# Patient Record
Sex: Male | Born: 1950 | Race: White | Hispanic: No | Marital: Married | Smoking: Former smoker
Health system: Southern US, Community
[De-identification: ages and names within clinical notes are randomized; demographics above are authoritative.]

## PROBLEM LIST (undated history)

## (undated) DIAGNOSIS — F1010 Alcohol abuse, uncomplicated: Secondary | ICD-10-CM

## (undated) DIAGNOSIS — I517 Cardiomegaly: Secondary | ICD-10-CM

## (undated) DIAGNOSIS — C61 Malignant neoplasm of prostate: Secondary | ICD-10-CM

## (undated) DIAGNOSIS — I10 Essential (primary) hypertension: Secondary | ICD-10-CM

## (undated) DIAGNOSIS — C4490 Unspecified malignant neoplasm of skin, unspecified: Secondary | ICD-10-CM

## (undated) DIAGNOSIS — I4819 Other persistent atrial fibrillation: Secondary | ICD-10-CM

## (undated) HISTORY — PX: SHOULDER SURGERY: SHX246

## (undated) HISTORY — DX: Cardiomegaly: I51.7

## (undated) HISTORY — DX: Essential (primary) hypertension: I10

## (undated) HISTORY — DX: Other persistent atrial fibrillation: I48.19

## (undated) HISTORY — PX: CATARACT EXTRACTION: SUR2

## (undated) HISTORY — DX: Alcohol abuse, uncomplicated: F10.10

---

## 2010-03-12 ENCOUNTER — Ambulatory Visit: Payer: Self-pay | Admitting: Cardiology

## 2010-03-18 ENCOUNTER — Ambulatory Visit: Payer: Self-pay | Admitting: Cardiology

## 2010-03-18 HISTORY — PX: US ECHOCARDIOGRAPHY: HXRAD669

## 2010-07-28 ENCOUNTER — Ambulatory Visit: Payer: Self-pay | Admitting: Cardiology

## 2010-10-23 ENCOUNTER — Other Ambulatory Visit: Payer: Self-pay | Admitting: *Deleted

## 2010-10-23 DIAGNOSIS — I10 Essential (primary) hypertension: Secondary | ICD-10-CM

## 2010-10-23 DIAGNOSIS — I4891 Unspecified atrial fibrillation: Secondary | ICD-10-CM

## 2010-10-23 MED ORDER — METOPROLOL TARTRATE 50 MG PO TABS: 50.0000 mg | ORAL_TABLET | Freq: Two times a day (BID) | ORAL | Status: DC

## 2010-10-23 MED ORDER — METOPROLOL SUCCINATE ER 50 MG PO TB24: 50.0000 mg | ORAL_TABLET | Freq: Every day | ORAL | Status: DC

## 2010-10-23 NOTE — Telephone Encounter (Signed)
Received refill request via fax

## 2011-03-23 ENCOUNTER — Encounter: Payer: Self-pay | Admitting: Cardiology

## 2011-04-07 ENCOUNTER — Ambulatory Visit: Payer: Self-pay | Admitting: Cardiology

## 2011-04-23 ENCOUNTER — Encounter: Payer: Self-pay | Admitting: Cardiology

## 2011-06-23 ENCOUNTER — Telehealth: Payer: Self-pay | Admitting: Cardiology

## 2011-06-23 NOTE — Telephone Encounter (Signed)
Spoke with patient and offered appointment for tomorrow but he was going to be out of town. Doesn't feel like out of rhythm now, worked in with Lawson Fiscal NP Friday am.

## 2011-06-23 NOTE — Telephone Encounter (Signed)
New message:  Abnormal heart rate has returned for several weeks/comes and goes.  He would like to be seen for this.  Please call him regarding this.  Offered Dec 6 appt.

## 2011-06-25 ENCOUNTER — Ambulatory Visit (INDEPENDENT_AMBULATORY_CARE_PROVIDER_SITE_OTHER): Payer: BC Managed Care – PPO | Admitting: Nurse Practitioner

## 2011-06-25 ENCOUNTER — Encounter: Payer: Self-pay | Admitting: Nurse Practitioner

## 2011-06-25 VITALS — BP 142/90 | HR 82 | Ht 71.75 in | Wt 223.0 lb

## 2011-06-25 DIAGNOSIS — I480 Paroxysmal atrial fibrillation: Secondary | ICD-10-CM | POA: Insufficient documentation

## 2011-06-25 DIAGNOSIS — I10 Essential (primary) hypertension: Secondary | ICD-10-CM | POA: Insufficient documentation

## 2011-06-25 DIAGNOSIS — I4891 Unspecified atrial fibrillation: Secondary | ICD-10-CM

## 2011-06-25 LAB — CBC WITH DIFFERENTIAL/PLATELET
Basophils Absolute: 0 10*3/uL (ref 0.0–0.1)
Basophils Relative: 0.5 % (ref 0.0–3.0)
Eosinophils Absolute: 0 10*3/uL (ref 0.0–0.7)
Eosinophils Relative: 0.6 % (ref 0.0–5.0)
HCT: 43.3 % (ref 39.0–52.0)
Hemoglobin: 14.8 g/dL (ref 13.0–17.0)
Lymphocytes Relative: 25.3 % (ref 12.0–46.0)
Lymphs Abs: 1.7 10*3/uL (ref 0.7–4.0)
MCHC: 34.1 g/dL (ref 30.0–36.0)
MCV: 95.1 fl (ref 78.0–100.0)
Monocytes Absolute: 0.5 10*3/uL (ref 0.1–1.0)
Monocytes Relative: 7.4 % (ref 3.0–12.0)
Neutro Abs: 4.3 10*3/uL (ref 1.4–7.7)
Neutrophils Relative %: 66.2 % (ref 43.0–77.0)
Platelets: 200 10*3/uL (ref 150.0–400.0)
RBC: 4.56 Mil/uL (ref 4.22–5.81)
RDW: 13.1 % (ref 11.5–14.6)
WBC: 6.6 10*3/uL (ref 4.5–10.5)

## 2011-06-25 LAB — BASIC METABOLIC PANEL
BUN: 22 mg/dL (ref 6–23)
CO2: 27 mEq/L (ref 19–32)
Calcium: 9 mg/dL (ref 8.4–10.5)
Chloride: 106 mEq/L (ref 96–112)
Creatinine, Ser: 1.1 mg/dL (ref 0.4–1.5)
GFR: 70.24 mL/min (ref >60.00)
Glucose, Bld: 97 mg/dL (ref 70–99)
Potassium: 3.7 mEq/L (ref 3.5–5.1)
Sodium: 141 mEq/L (ref 135–145)

## 2011-06-25 LAB — TSH: TSH: 1.51 u[IU]/mL (ref 0.35–5.50)

## 2011-06-25 MED ORDER — DABIGATRAN ETEXILATE MESYLATE 150 MG PO CAPS: 150.0000 mg | ORAL_CAPSULE | Freq: Two times a day (BID) | ORAL | Status: DC

## 2011-06-25 NOTE — Patient Instructions (Signed)
We are going to check some labs today.  We are going to update your ultrasound of your heart  We are going to put you back on Pradaxa 150 mg two times a day.  Stop your aspirin on Sunday.  We are going to put a monitor on you to look at your heart rhythm for the next month.  We will see you in a month.  Call the [facility] office at [phone] if you have any questions, problems or concerns.

## 2011-06-25 NOTE — Progress Notes (Signed)
   Villa Herb Date of Birth: [birth year] Medical Record #[id number]  History of Present Illness: Thomas Patterson is seen today for a work in visit. He is seen for Dr. Patty Sermons. I have taken care of his father for many years. Jia is complaining of recurrent palpitations. Felt like he has had recurrent atrial fib. This has been going on for a couple of weeks. He feels like it comes and goes. He does check his pulse and notes irregularity. He is symptomatic with some shortness of breath, chest tightness and easy fatigue. Not really dizzy. No syncope. Remains on aspirin. Notes that he cut his Toprol back to just a half some time ago due to fatigue.   Current Outpatient Prescriptions on File Prior to Visit  Medication Sig Dispense Refill  . aspirin 325 MG tablet Take 325 mg by mouth daily.        Marland Kitchen atorvastatin (LIPITOR) 20 MG tablet Take 20 mg by mouth daily.        . hydrochlorothiazide (HYDRODIURIL) 25 MG tablet Take 25 mg by mouth daily.        . quinapril (ACCUPRIL) 40 MG tablet Take 40 mg by mouth at bedtime.        Marland Kitchen DISCONTD: metoprolol (TOPROL XL) 50 MG 24 hr tablet Take 1 tablet (50 mg total) by mouth daily.  30 tablet  11    No Known Allergies  Past Medical History  Diagnosis Date  . Paroxysmal atrial fibrillation   . Palpitations   . Hypertension   . LVH (left ventricular hypertrophy)     Mild per echo in 2011    Past Surgical History  Procedure Date  . US echocardiography 03/18/2010    EF 55-60%    History  Smoking status  . Former Smoker  . Quit date: 07/26/1974  Smokeless tobacco  . Not on file    History  Alcohol Use  . Yes    Family History  Problem Relation Age of Onset  . Heart failure Father   . Cardiomyopathy Father   . Atrial fibrillation Father     Review of Systems: The review of systems is per the HPI.  All other systems were reviewed and are negative.  Physical Exam: BP 142/90  Pulse 82  Ht 5' 11.75" (1.822 m)  Wt 223 lb (101.152 kg)  BMI  30.46 kg/m2 Patient is very pleasant and in no acute distress. Skin is warm and dry. Color is normal.  HEENT is unremarkable. Normocephalic/atraumatic. PERRL. Sclera are nonicteric. Neck is supple. No masses. No JVD. Lungs are clear. Cardiac exam shows an irregular rhythm. Rate is controlled. Abdomen is soft. Extremities are without edema. Gait and ROM are intact. No gross neurologic deficits noted.   LABORATORY DATA: EKG shows atrial fib/flutter. Rate is controlled. Tracing is reviewed with Dr. Patty Sermons.   Assessment / Plan:

## 2011-06-25 NOTE — Assessment & Plan Note (Addendum)
He is out of rhythm today. Difficult to say if he is going in and out versus just staying out. I have discussed his case with Dr. Patty Sermons. We will check labs today, update his echo and place an event monitor for the next 30 days to help define his treatment plan. We will also place him back on Pradaxa 150 mg BID. Samples are given. Prescription is sent to the drug store. He is to stop his aspirin on Sunday. We will see him back in one month. Patient is agreeable to this plan and will call if any problems develop in the interim.

## 2011-06-25 NOTE — Assessment & Plan Note (Signed)
Currently on medicines. Has known mild LVH per last echo. We will update.

## 2011-06-26 DIAGNOSIS — I4891 Unspecified atrial fibrillation: Secondary | ICD-10-CM

## 2011-06-28 ENCOUNTER — Ambulatory Visit (HOSPITAL_COMMUNITY): Payer: BC Managed Care – PPO | Attending: Nurse Practitioner | Admitting: Radiology

## 2011-06-28 DIAGNOSIS — I4891 Unspecified atrial fibrillation: Secondary | ICD-10-CM

## 2011-06-28 DIAGNOSIS — I059 Rheumatic mitral valve disease, unspecified: Secondary | ICD-10-CM | POA: Insufficient documentation

## 2011-06-28 DIAGNOSIS — I079 Rheumatic tricuspid valve disease, unspecified: Secondary | ICD-10-CM | POA: Insufficient documentation

## 2011-06-28 DIAGNOSIS — I10 Essential (primary) hypertension: Secondary | ICD-10-CM | POA: Insufficient documentation

## 2011-06-28 DIAGNOSIS — I379 Nonrheumatic pulmonary valve disorder, unspecified: Secondary | ICD-10-CM | POA: Insufficient documentation

## 2011-06-28 DIAGNOSIS — R002 Palpitations: Secondary | ICD-10-CM | POA: Insufficient documentation

## 2011-06-29 ENCOUNTER — Telehealth: Payer: Self-pay | Admitting: *Deleted

## 2011-06-29 NOTE — Telephone Encounter (Signed)
Advised of results

## 2011-06-29 NOTE — Telephone Encounter (Signed)
Message copied by Burnell Blanks on Tue Jun 29, 2011  9:55 AM ------      Message from: Cassell Clement      Created: Mon Jun 28, 2011  7:41 PM       Please report.  The echocardiogram is satisfactory and shows good left ventricular systolic function.  Both atria are mildly dilated.  No thrombi were seen.  The valve function appeared to be good.  Continue present regimen.

## 2011-06-29 NOTE — Telephone Encounter (Signed)
Message copied by Burnell Blanks on Tue Jun 29, 2011  9:55 AM ------      Message from: Rosalio Macadamia      Created: Mon Jun 28, 2011  7:48 AM       Ok to report. Labs are satisfactory.

## 2011-06-29 NOTE — Telephone Encounter (Signed)
Message copied by Burnell Blanks on Tue Jun 29, 2011  9:51 AM ------      Message from: Cassell Clement      Created: Mon Jun 28, 2011  7:41 PM       Please report.  The echocardiogram is satisfactory and shows good left ventricular systolic function.  Both atria are mildly dilated.  No thrombi were seen.  The valve function appeared to be good.  Continue present regimen.

## 2011-07-13 ENCOUNTER — Telehealth: Payer: Self-pay | Admitting: *Deleted

## 2011-07-13 NOTE — Telephone Encounter (Signed)
Dr. Patty Sermons reviewed ecardio readings and patient patient has a fib with elevated heart rate. Will increase his Toprol to 50 mg daily.  Advised patient and will continue to monitor.

## 2011-07-28 ENCOUNTER — Encounter: Payer: Self-pay | Admitting: Cardiology

## 2011-08-02 ENCOUNTER — Encounter: Payer: Self-pay | Admitting: Cardiology

## 2011-08-02 ENCOUNTER — Ambulatory Visit (INDEPENDENT_AMBULATORY_CARE_PROVIDER_SITE_OTHER): Payer: BC Managed Care – PPO | Admitting: Cardiology

## 2011-08-02 VITALS — BP 138/80 | HR 80 | Ht 72.0 in | Wt 216.0 lb

## 2011-08-02 DIAGNOSIS — I119 Hypertensive heart disease without heart failure: Secondary | ICD-10-CM

## 2011-08-02 DIAGNOSIS — I480 Paroxysmal atrial fibrillation: Secondary | ICD-10-CM

## 2011-08-02 DIAGNOSIS — I10 Essential (primary) hypertension: Secondary | ICD-10-CM

## 2011-08-02 DIAGNOSIS — I4891 Unspecified atrial fibrillation: Secondary | ICD-10-CM

## 2011-08-02 MED ORDER — LOSARTAN POTASSIUM 50 MG PO TABS: 50.0000 mg | ORAL_TABLET | Freq: Every day | ORAL | Status: DC

## 2011-08-02 NOTE — Progress Notes (Signed)
Thomas Patterson Date of Birth:  24-Dec-1950 [facility] [address] [place], [place]  [address] [phone]         Fax   [phone]  History of Present Illness: This pleasant 61 year old gentleman is seen for a scheduled followup office visit.  The patient has a history of atrial fibrillation.  His atrial fibrillation has previously been paroxysmal.  He had a recent 30 day heart monitor which showed that he was in atrial fibrillation the entire time is less aware of his heart rhythm now.  He rarely has any shortness of breath and he denies any chest pain.  He has lost 7 pounds and his blood pressure is doing better.  He is due to have his annual physical examination at [facility] next month.  He has not had any symptoms of TIA or stroke.  He remains on pradaxa 150 mg twice a day.  He has been having some symptoms which he thinks is from his quinapril.  After he takes it he feels a tightness in his throat making it difficulty for for him to put his tie  on in the morning comfortably.  The patient continues to drink a moderate amount of beer.  He would drink 2 or 3 beers most nights and sometimes up to 6-8 beers.  Last night because of a family gathering he estimates he had 6-8 beers.  Current Outpatient Prescriptions  Medication Sig Dispense Refill  . atorvastatin (LIPITOR) 20 MG tablet Take 20 mg by mouth daily.        . dabigatran (PRADAXA) 150 MG CAPS Take 1 capsule (150 mg total) by mouth every 12 (twelve) hours.  60 capsule  6  . hydrochlorothiazide (HYDRODIURIL) 25 MG tablet Take 25 mg by mouth daily.        . metoprolol (TOPROL-XL) 50 MG 24 hr tablet Take 50 mg by mouth daily.       Marland Kitchen losartan (COZAAR) 50 MG tablet Take 1 tablet (50 mg total) by mouth daily.  30 tablet  11  . DISCONTD: metoprolol (TOPROL XL) 50 MG 24 hr tablet Take 1 tablet (50 mg total) by mouth daily.  30 tablet  11    No Known Allergies  Patient Active Problem List  Diagnoses  . PAF (paroxysmal  atrial fibrillation)  . HTN (hypertension)    History  Smoking status  . Former Smoker  . Quit date: 07/26/1974  Smokeless tobacco  . Not on file    History  Alcohol Use  . Yes    Family History  Problem Relation Age of Onset  . Heart failure Father   . Cardiomyopathy Father   . Atrial fibrillation Father     Review of Systems: Constitutional: no fever chills diaphoresis or fatigue or change in weight.  Head and neck: no hearing loss, no epistaxis, no photophobia or visual disturbance. Respiratory: No cough, shortness of breath or wheezing. Cardiovascular: No chest pain peripheral edema, palpitations. Gastrointestinal: No abdominal distention, no abdominal pain, no change in bowel habits hematochezia or melena. Genitourinary: No dysuria, no frequency, no urgency, no nocturia. Musculoskeletal:No arthralgias, no back pain, no gait disturbance or myalgias. Neurological: No dizziness, no headaches, no numbness, no seizures, no syncope, no weakness, no tremors. Hematologic: No lymphadenopathy, no easy bruising. Psychiatric: No confusion, no hallucinations, no sleep disturbance.    Physical Exam: Filed Vitals:   08/02/11 1109  BP: 138/80  Pulse: 80   the general appearance reveals a well-developed well-nourished gentleman in no  distress.Pupils equal and reactive.   Extraocular Movements are full.  There is no scleral icterus.  The mouth and pharynx are normal.  The neck is supple.  The carotids reveal no bruits.  The jugular venous pressure is normal.  The thyroid is not enlarged.  There is no lymphadenopathy.  The chest is clear to percussion and auscultation. There are no rales or rhonchi. Expansion of the chest is symmetrical.  The precordium is quiet.  The first heart sound is normal.  The second heart sound is physiologically split.  There is no murmur gallop rub or click.  There is no abnormal lift or heave.  The pulse is irregular in atrial fibrillation with a  controlled ventricular response.  The abdomen is soft and nontender. Bowel sounds are normal. The liver and spleen are not enlarged. There Are no abdominal masses. There are no bruits.  The pedal pulses are good.  There is no phlebitis or edema.  There is no cyanosis or clubbing. Strength is normal and symmetrical in all extremities.  There is no lateralizing weakness.  There are no sensory deficits.  The skin is warm and dry.  There is no rash.    Assessment / Plan: Continue same medication except stop quinapril and begin losartan 50 mg one daily.  Also asked him to cut way back on his beer consumption which may be contributing to his tendency toward maintaining atrial fibrillation.  He'll return in 2 months for followup office visit EKG and basal metabolic panel.  Next month he will have his annual physical at [facility].

## 2011-08-02 NOTE — Assessment & Plan Note (Signed)
Blood pressure has improved since he has lost weight since last visit.  He is trying to watch his diet and cut back on salt.

## 2011-08-02 NOTE — Assessment & Plan Note (Signed)
The patient denies any symptoms of congestive heart failure.  Having no chest pain.  He is not having any TIA symptoms.  No side effects from the blood thinner

## 2011-08-02 NOTE — Patient Instructions (Signed)
Stop Quinapril and begin Losartan 50 mg daily Decrease alcohol intake Your physician recommends that you schedule a follow-up appointment in: 2 months with labs (bmet) and EKG

## 2011-10-14 ENCOUNTER — Encounter: Payer: Self-pay | Admitting: Cardiology

## 2011-10-15 ENCOUNTER — Encounter: Payer: Self-pay | Admitting: Cardiology

## 2011-10-15 ENCOUNTER — Other Ambulatory Visit: Payer: BC Managed Care – PPO

## 2011-10-15 ENCOUNTER — Ambulatory Visit (INDEPENDENT_AMBULATORY_CARE_PROVIDER_SITE_OTHER): Payer: BC Managed Care – PPO | Admitting: Cardiology

## 2011-10-15 VITALS — BP 118/86 | HR 80 | Ht 71.0 in | Wt 220.0 lb

## 2011-10-15 DIAGNOSIS — I119 Hypertensive heart disease without heart failure: Secondary | ICD-10-CM

## 2011-10-15 DIAGNOSIS — I4891 Unspecified atrial fibrillation: Secondary | ICD-10-CM

## 2011-10-15 DIAGNOSIS — I10 Essential (primary) hypertension: Secondary | ICD-10-CM

## 2011-10-15 DIAGNOSIS — I480 Paroxysmal atrial fibrillation: Secondary | ICD-10-CM

## 2011-10-15 NOTE — Patient Instructions (Signed)
Your physician recommends that you continue on your current medications as directed. Please refer to the Current Medication list given to you today.  Your physician recommends that you schedule a follow-up appointment in: 3 months  

## 2011-10-15 NOTE — Assessment & Plan Note (Signed)
The patient is now starting on Cozaar 100 mg daily which will help bring his blood pressure down further.  He has not been having any symptoms from his high blood pressure such as headaches or dizzy spells

## 2011-10-15 NOTE — Assessment & Plan Note (Signed)
The patient remains in atrial fibrillation.  He consumes 2 or 3 beers most nights and sometimes up to 6-8 beers.  We have discussed in the past and again on this visit that the alcohol that he consumes is probably playing a role in his atrial fibrillation.  We would want him to cut way back on his alcohol before trying to electrically cardiovert him.  He is tolerating his arrhythmia fairly well and does not appear to be motivated to change in his social habits at this point.

## 2011-10-15 NOTE — Progress Notes (Signed)
Villa Herb Date of Birth:  [birth year] [facility] [address] [place], [place]  [address] [phone]         Fax   [phone]  History of Present Illness: This pleasant 61 year old gentleman is seen for followup office visit.  He has a history of established atrial fibrillation.  He is on pradaxa 150 mg twice a day.  He has not been experiencing any TIA symptoms.  He denies any chest pain or increasing shortness of breath.  He had a complete physical with his primary care provider yesterday but has not heard about any of the lab work yet.  His blood pressure at his physical was a little high and he is now on a higher dose of Cozaar 100 mg each morning.  Current Outpatient Prescriptions  Medication Sig Dispense Refill  . atorvastatin (LIPITOR) 20 MG tablet Take 20 mg by mouth daily.        . dabigatran (PRADAXA) 150 MG CAPS Take 1 capsule (150 mg total) by mouth every 12 (twelve) hours.  60 capsule  6  . hydrochlorothiazide (HYDRODIURIL) 25 MG tablet Take 25 mg by mouth daily.        Marland Kitchen losartan (COZAAR) 50 MG tablet Take 100 mg by mouth daily.      . metoprolol (TOPROL-XL) 50 MG 24 hr tablet Take 50 mg by mouth daily.       Marland Kitchen DISCONTD: metoprolol (TOPROL XL) 50 MG 24 hr tablet Take 1 tablet (50 mg total) by mouth daily.  30 tablet  11    No Known Allergies  Patient Active Problem List  Diagnoses  . PAF (paroxysmal atrial fibrillation)  . HTN (hypertension)    History  Smoking status  . Former Smoker  . Quit date: 07/26/1974  Smokeless tobacco  . Not on file    History  Alcohol Use  . Yes    Family History  Problem Relation Age of Onset  . Heart failure Father   . Cardiomyopathy Father   . Atrial fibrillation Father     Review of Systems: Constitutional: no fever chills diaphoresis or fatigue or change in weight.  Head and neck: no hearing loss, no epistaxis, no photophobia or visual disturbance. Respiratory: No cough, shortness of breath  or wheezing. Cardiovascular: No chest pain peripheral edema, palpitations. Gastrointestinal: No abdominal distention, no abdominal pain, no change in bowel habits hematochezia or melena. Genitourinary: No dysuria, no frequency, no urgency, no nocturia. Musculoskeletal:No arthralgias, no back pain, no gait disturbance or myalgias. Neurological: No dizziness, no headaches, no numbness, no seizures, no syncope, no weakness, no tremors. Hematologic: No lymphadenopathy, no easy bruising. Psychiatric: No confusion, no hallucinations, no sleep disturbance.    Physical Exam: Filed Vitals:   10/15/11 1059  BP: 118/86  Pulse: 80   the general appearance reveals a well-developed well-nourished gentleman in no distress.The head and neck exam reveals pupils equal and reactive.  Extraocular movements are full.  There is no scleral icterus.  The mouth and pharynx are normal.  The neck is supple.  The carotids reveal no bruits.  The jugular venous pressure is normal.  The  thyroid is not enlarged.  There is no lymphadenopathy.  The chest is clear to percussion and auscultation.  There are no rales or rhonchi.  Expansion of the chest is symmetrical.  The precordium is quiet.  The first heart sound is normal.  The second heart sound is physiologically split.  There is no murmur gallop rub or  click.  There is no abnormal lift or heave.  The rhythm is irregular .The abdomen is soft and nontender.  The bowel sounds are normal.  The liver and spleen are not enlarged.  There are no abdominal masses.  There are no abdominal bruits.  Extremities reveal good pedal pulses.  There is no phlebitis or edema.  There is no cyanosis or clubbing.  Strength is normal and symmetrical in all extremities.  There is no lateralizing weakness.  There are no sensory deficits.  The skin is warm and dry.  There is no rash.  EKG shows atrial fibrillation with controlled ventricular response and no ischemic changes.   Assessment /  Plan: Continue same medication.  Try to work on cutting back on alcohol.  Recheck in 4 months for followup office visit

## 2011-10-24 ENCOUNTER — Other Ambulatory Visit: Payer: Self-pay | Admitting: Cardiology

## 2011-10-25 NOTE — Telephone Encounter (Signed)
..   Requested Prescriptions   Pending Prescriptions Disp Refills  . metoprolol succinate (TOPROL-XL) 50 MG 24 hr tablet [Pharmacy Med Name: METOPROLOL SUCC ER 50 MG TAB] 30 tablet 10    Sig: TAKE 1 TABLET EVERY DAY

## 2011-12-28 ENCOUNTER — Ambulatory Visit: Payer: BC Managed Care – PPO | Admitting: Cardiology

## 2012-01-20 ENCOUNTER — Encounter: Payer: Self-pay | Admitting: Cardiology

## 2012-01-20 ENCOUNTER — Ambulatory Visit (INDEPENDENT_AMBULATORY_CARE_PROVIDER_SITE_OTHER): Payer: BC Managed Care – PPO | Admitting: Cardiology

## 2012-01-20 VITALS — BP 120/80 | HR 80 | Ht 71.0 in | Wt 218.0 lb

## 2012-01-20 DIAGNOSIS — I480 Paroxysmal atrial fibrillation: Secondary | ICD-10-CM

## 2012-01-20 DIAGNOSIS — I10 Essential (primary) hypertension: Secondary | ICD-10-CM

## 2012-01-20 DIAGNOSIS — I4891 Unspecified atrial fibrillation: Secondary | ICD-10-CM

## 2012-01-20 MED ORDER — DILTIAZEM HCL ER COATED BEADS 180 MG PO CP24: 180.0000 mg | ORAL_CAPSULE | Freq: Every day | ORAL | Status: DC

## 2012-01-20 MED ORDER — RIVAROXABAN 20 MG PO TABS: 20.0000 mg | ORAL_TABLET | Freq: Every day | ORAL | Status: DC

## 2012-01-20 NOTE — Patient Instructions (Addendum)
STOP Pradaxa and START Xarelto 20 mg daily  STOP Metoprolol (Toprol) and START Cardizem CD 180 mg daily  Your physician wants you to follow-up in: 3 month  You will receive a reminder letter in the mail two months in advance. If you don't receive a letter, please call our office to schedule the follow-up appointment.

## 2012-01-20 NOTE — Progress Notes (Signed)
Thomas Patterson Date of Birth:  1950-12-01 [facility] [address] [place], [place]  [address] [phone]         Fax   [phone]  History of Present Illness: This pleasant 61 year old gentleman is seen for a scheduled followup office visit.  He has atrial fibrillation.  Previously his atrial fibrillation was paroxysmal but recently it has become more established.  He has not had an attempt at cardioversion.  He does drink a moderate amount of beer on a regular basis we have talked about that on his previous visits.  He is interested in an attempt at cardioversion.  He states that he has not been compliant with taking his Pradaxa because he often forgets to take it in the evening.  We talked about switching to Xarelto which she could take just once a day and he felt that he could be more compliant with a once a day pill.  In addition, he complains of no energy from the Toprol and he frequently takes just half a tablet instead of all tablet because taking a whole tablet makes him feel so tired.  Current Outpatient Prescriptions  Medication Sig Dispense Refill  . atorvastatin (LIPITOR) 20 MG tablet Take 20 mg by mouth daily.        . hydrochlorothiazide (HYDRODIURIL) 25 MG tablet Take 25 mg by mouth daily.        Marland Kitchen losartan (COZAAR) 100 MG tablet Take 100 mg by mouth daily.      Marland Kitchen diltiazem (CARDIZEM CD) 180 MG 24 hr capsule Take 1 capsule (180 mg total) by mouth daily.  30 capsule  5  . Rivaroxaban (XARELTO) 20 MG TABS Take 1 tablet (20 mg total) by mouth daily.  30 tablet  5    No Known Allergies  Patient Active Problem List  Diagnosis  . PAF (paroxysmal atrial fibrillation)  . HTN (hypertension)    History  Smoking status  . Former Smoker  . Quit date: 07/26/1974  Smokeless tobacco  . Not on file    History  Alcohol Use  . Yes    Family History  Problem Relation Age of Onset  . Heart failure Father   . Cardiomyopathy Father   . Atrial  fibrillation Father     Review of Systems: Constitutional: no fever chills diaphoresis or fatigue or change in weight.  Head and neck: no hearing loss, no epistaxis, no photophobia or visual disturbance. Respiratory: No cough, shortness of breath or wheezing. Cardiovascular: No chest pain peripheral edema, palpitations. Gastrointestinal: No abdominal distention, no abdominal pain, no change in bowel habits hematochezia or melena. Genitourinary: No dysuria, no frequency, no urgency, no nocturia. Musculoskeletal:No arthralgias, no back pain, no gait disturbance or myalgias. Neurological: No dizziness, no headaches, no numbness, no seizures, no syncope, no weakness, no tremors. Hematologic: No lymphadenopathy, no easy bruising. Psychiatric: No confusion, no hallucinations, no sleep disturbance.    Physical Exam: Filed Vitals:   01/20/12 1117  BP: 120/80  Pulse: 80   the general appearance reveals a well-developed well-nourished gentleman in no distress.The head and neck exam reveals pupils equal and reactive.  Extraocular movements are full.  There is no scleral icterus.  The mouth and pharynx are normal.  The neck is supple.  The carotids reveal no bruits.  The jugular venous pressure is normal.  The  thyroid is not enlarged.  There is no lymphadenopathy.  The chest is clear to percussion and auscultation.  There are no rales or  rhonchi.  Expansion of the chest is symmetrical.  The precordium is quiet.  The pulse is irregularly irregular in atrial fib with a controlled ventricular response.  The first heart sound is normal.  The second heart sound is physiologically split.  There is no murmur gallop rub or click.  There is no abnormal lift or heave.  The abdomen is soft and nontender.  The bowel sounds are normal.  The liver and spleen are not enlarged.  There are no abdominal masses.  There are no abdominal bruits.  Extremities reveal good pedal pulses.  There is no phlebitis or edema.  There is  no cyanosis or clubbing.  Strength is normal and symmetrical in all extremities.  There is no lateralizing weakness.  There are no sensory deficits.  The skin is warm and dry.  There is no rash.     Assessment / Plan: Because of his fatigue we are going to stop his beta blocker and switch him to Cardizem CD 180 mg one daily. Because he has been forgetting to take his twice a day Pradaxa we are going to switch him to Xarelto 20 mg one daily.  He has normal renal function. He would like to try getting increased exercise and cutting back on his alcohol over the summer and he would like to talk about attempt at elective cardioversion when we see him in 3 months.  I offered him an earlier date such as early August but he preferred to wait and have a longer time to make adjustments in his diet and he and his exercise program and to cut back on alcohol.  He understands that the alcohol makes long-term success of cardioversion less likely.  He will return in 3 months for followup office visit EKG and basal metabolic panel, after which we will talk about looking for a date for cardioversion

## 2012-01-20 NOTE — Assessment & Plan Note (Signed)
Blood pressure has been remaining stable on current therapy.  He is now taking losartan 100 mg daily instead of 50 mg daily.

## 2012-01-20 NOTE — Assessment & Plan Note (Signed)
The patient has not been having any TIA symptoms.  He denies any chest pain or shortness of breath with ordinary activity but is fatigued when he has to climb stairs or walk up a hill.

## 2012-07-03 ENCOUNTER — Other Ambulatory Visit: Payer: Self-pay

## 2012-07-03 DIAGNOSIS — I4891 Unspecified atrial fibrillation: Secondary | ICD-10-CM

## 2012-07-03 MED ORDER — DILTIAZEM HCL ER COATED BEADS 180 MG PO CP24: 180.0000 mg | ORAL_CAPSULE | Freq: Every day | ORAL | Status: DC

## 2012-08-08 ENCOUNTER — Telehealth: Payer: Self-pay | Admitting: *Deleted

## 2012-08-08 ENCOUNTER — Encounter: Payer: Self-pay | Admitting: Cardiology

## 2012-08-08 ENCOUNTER — Ambulatory Visit (INDEPENDENT_AMBULATORY_CARE_PROVIDER_SITE_OTHER): Payer: BC Managed Care – PPO | Admitting: Cardiology

## 2012-08-08 VITALS — BP 132/74 | HR 89 | Resp 18 | Ht 71.0 in | Wt 219.0 lb

## 2012-08-08 DIAGNOSIS — I4891 Unspecified atrial fibrillation: Secondary | ICD-10-CM

## 2012-08-08 DIAGNOSIS — I480 Paroxysmal atrial fibrillation: Secondary | ICD-10-CM

## 2012-08-08 DIAGNOSIS — I10 Essential (primary) hypertension: Secondary | ICD-10-CM

## 2012-08-08 DIAGNOSIS — I119 Hypertensive heart disease without heart failure: Secondary | ICD-10-CM

## 2012-08-08 MED ORDER — DILTIAZEM HCL ER COATED BEADS 180 MG PO CP24: 180.0000 mg | ORAL_CAPSULE | Freq: Every day | ORAL | Status: DC

## 2012-08-08 MED ORDER — RIVAROXABAN 20 MG PO TABS: 20.0000 mg | ORAL_TABLET | Freq: Every day | ORAL | Status: DC

## 2012-08-08 NOTE — Patient Instructions (Addendum)
Your physician recommends that you continue on your current medications as directed. Please refer to the Current Medication list given to you today.  Your physician wants you to follow-up in: 4 MONTH OV/EKG You will receive a reminder letter in the mail two months in advance. If you don't receive a letter, please call our office to schedule the follow-up appointment.  

## 2012-08-08 NOTE — Progress Notes (Signed)
Thomas Patterson Date of Birth:  26-Aug-1950 [facility] [address] [place], [place]  [address] [phone]         Fax   [phone]  History of Present Illness: This pleasant 62 year old gentleman is seen for a scheduled followup office visit. He has atrial fibrillation. Previously his atrial fibrillation was paroxysmal but recently it has become more established. He has not had an attempt at cardioversion. He does drink a moderate amount of beer on a regular basis we have talked about that on his previous visits. He is interested in an attempt at cardioversion.  At his last visit we switched him from Pradaxa to Xarelto and he prefers Xarelto because of its side effect profile.  The patient is disappointed that he has not been able to lose weight.  He is a member of the gym.   Current Outpatient Prescriptions  Medication Sig Dispense Refill  . atorvastatin (LIPITOR) 20 MG tablet Take 20 mg by mouth daily.        Marland Kitchen diltiazem (CARDIZEM CD) 180 MG 24 hr capsule Take 1 capsule (180 mg total) by mouth daily.  90 capsule  3  . hydrochlorothiazide (HYDRODIURIL) 25 MG tablet Take 25 mg by mouth daily.        Marland Kitchen losartan (COZAAR) 100 MG tablet Take 100 mg by mouth daily.      . Rivaroxaban (XARELTO) 20 MG TABS Take 1 tablet (20 mg total) by mouth daily.  90 tablet  3    No Known Allergies  Patient Active Problem List  Diagnosis  . PAF (paroxysmal atrial fibrillation)  . HTN (hypertension)    History  Smoking status  . Former Smoker  . Quit date: 07/26/1974  Smokeless tobacco  . Not on file    History  Alcohol Use  . Yes    Family History  Problem Relation Age of Onset  . Heart failure Father   . Cardiomyopathy Father   . Atrial fibrillation Father     Review of Systems: Constitutional: no fever chills diaphoresis or fatigue or change in weight.  Head and neck: no hearing loss, no epistaxis, no photophobia or visual disturbance. Respiratory: No cough,  shortness of breath or wheezing. Cardiovascular: No chest pain peripheral edema, palpitations. Gastrointestinal: No abdominal distention, no abdominal pain, no change in bowel habits hematochezia or melena. Genitourinary: No dysuria, no frequency, no urgency, no nocturia. Musculoskeletal:No arthralgias, no back pain, no gait disturbance or myalgias. Neurological: No dizziness, no headaches, no numbness, no seizures, no syncope, no weakness, no tremors. Hematologic: No lymphadenopathy, no easy bruising. Psychiatric: No confusion, no hallucinations, no sleep disturbance.    Physical Exam: Filed Vitals:   08/08/12 1102  BP: 132/74  Pulse: 89  Resp: 18   the general appearance reveals a well-developed well-nourished gentleman in no distress.The head and neck exam reveals pupils equal and reactive.  Extraocular movements are full.  There is no scleral icterus.  The mouth and pharynx are normal.  The neck is supple.  The carotids reveal no bruits.  The jugular venous pressure is normal.  The  thyroid is not enlarged.  There is no lymphadenopathy.  The chest is clear to percussion and auscultation.  There are no rales or rhonchi.  Expansion of the chest is symmetrical.  The precordium is quiet.  The pulse is irregularly irregular The first heart sound is normal.  The second heart sound is physiologically split.  There is no murmur gallop rub or click.  There is no abnormal lift or heave.  The abdomen is soft and nontender.  The bowel sounds are normal.  The liver and spleen are not enlarged.  There are no abdominal masses.  There are no abdominal bruits.  Extremities reveal good pedal pulses.  There is no phlebitis or edema.  There is no cyanosis or clubbing.  Strength is normal and symmetrical in all extremities.  There is no lateralizing weakness.  There are no sensory deficits.  The skin is warm and dry.  There is no rash.     Assessment / Plan: The patient is to continue on same medication.  He  needs to cut back on beer consumption and on overall alcohol consumption because it is helping to keep him in atrial fibrillation.  There is no point in attempting a cardioversion yet.  He will try to cut back further on alcohol intake.  He also intends to lose weight and to increase aerobic exercise at the gym.  Recheck in 4 months for followup office visit and EKG.

## 2012-08-08 NOTE — Assessment & Plan Note (Signed)
Blood pressure was remaining stable on current medication.  He is tolerating diltiazem better than beta blocker.  The beta blocker was causing excessive fatigue.

## 2012-08-08 NOTE — Assessment & Plan Note (Signed)
The patient remains in atrial fibrillation with a controlled ventricular response.  He has not had any TIA symptoms.Marland Kitchen

## 2012-08-08 NOTE — Telephone Encounter (Signed)
-----   Message ----- From: Villa Herb Sent: 08/08/2012 2:59 PM To: [facility] Triage Subject: Visit Follow-Up Question On my visit today with Dr. Patty Sermons I forgot to ask him about my colonoscopy that I have shceduled in February. When I made the appointment they told me they prefer stopage of any blood thinners 5 days before the procedure. Seems we may have touched on this at a former appoinment and he may have said 24 hrs before. What is the correct procedure?  Discussed with  Dr. Patty Sermons and patient can hold Xarelto for 48 hours prior to procedure. Left message for patient to call if he doesn't receive reply

## 2012-09-09 ENCOUNTER — Other Ambulatory Visit: Payer: Self-pay

## 2013-05-31 ENCOUNTER — Other Ambulatory Visit: Payer: Self-pay

## 2013-08-09 ENCOUNTER — Other Ambulatory Visit: Payer: Self-pay | Admitting: Cardiology

## 2013-08-20 ENCOUNTER — Ambulatory Visit: Payer: BC Managed Care – PPO | Admitting: Cardiology

## 2013-08-29 ENCOUNTER — Ambulatory Visit (INDEPENDENT_AMBULATORY_CARE_PROVIDER_SITE_OTHER): Payer: BC Managed Care – PPO | Admitting: Cardiology

## 2013-08-29 ENCOUNTER — Encounter: Payer: Self-pay | Admitting: Cardiology

## 2013-08-29 VITALS — BP 160/90 | HR 108 | Ht 71.0 in | Wt 215.0 lb

## 2013-08-29 DIAGNOSIS — I4891 Unspecified atrial fibrillation: Secondary | ICD-10-CM

## 2013-08-29 DIAGNOSIS — I10 Essential (primary) hypertension: Secondary | ICD-10-CM

## 2013-08-29 DIAGNOSIS — I480 Paroxysmal atrial fibrillation: Secondary | ICD-10-CM

## 2013-08-29 MED ORDER — DILTIAZEM HCL ER COATED BEADS 240 MG PO CP24
240.0000 mg | ORAL_CAPSULE | Freq: Every day | ORAL | Status: DC
Start: 2013-08-29 — End: 2014-07-02

## 2013-08-29 NOTE — Assessment & Plan Note (Signed)
Blood pressure today is mildly elevated.  His weight is down 4 pounds since last visit.  He is going to start checking his blood pressure at home.  For better blood pressure control and rate control I will increase his Cardizem CD up to 240 mg daily.

## 2013-08-29 NOTE — Patient Instructions (Signed)
INCREASE YOUR CARDIZEM CD TO 240 MG DAILY, RX SENT TO [facility]  Your physician wants you to follow-up in: [place] will receive a reminder letter in the mail two months in advance. If you don't receive a letter, please call our office to schedule the follow-up appointment.

## 2013-08-29 NOTE — Progress Notes (Signed)
Thomas Patterson Date of Birth:  1951-03-13 [address] [place], [place]  [address] [phone]         Fax   [phone]  History of Present Illness: This pleasant 63 year old gentleman is seen for a scheduled followup office visit. He has atrial fibrillation. Previously his atrial fibrillation was paroxysmal but recently it has become more established. He has not had an attempt at cardioversion. He does drink a moderate amount of beer on a regular basis we have talked about that on his previous visits.   At his last visit we switched him from Pradaxa to Xarelto and he prefers Xarelto because of its side effect profile.  The patient is disappointed that he has not been able to lose weight.  He is a member of the gym but he has not been going to the gym lately.   Current Outpatient Prescriptions  Medication Sig Dispense Refill  . atorvastatin (LIPITOR) 20 MG tablet Take 20 mg by mouth daily.        Marland Kitchen diltiazem (CARDIZEM CD) 240 MG 24 hr capsule Take 1 capsule (240 mg total) by mouth daily.  90 capsule  3  . hydrochlorothiazide (HYDRODIURIL) 25 MG tablet Take 25 mg by mouth daily.        Marland Kitchen losartan (COZAAR) 100 MG tablet Take 100 mg by mouth daily.      Alveda Reasons 20 MG TABS tablet TAKE 1 TABLET (20 MG TOTAL) BY MOUTH DAILY.  90 tablet  0   No current facility-administered medications for this visit.    No Known Allergies  Patient Active Problem List   Diagnosis Date Noted  . PAF (paroxysmal atrial fibrillation) 06/25/2011  . HTN (hypertension) 06/25/2011    History  Smoking status  . Former Smoker  . Quit date: 07/26/1974  Smokeless tobacco  . Not on file    History  Alcohol Use  . Yes    Family History  Problem Relation Age of Onset  . Heart failure Father   . Cardiomyopathy Father   . Atrial fibrillation Father     Review of Systems: Constitutional: no fever chills diaphoresis or fatigue or change in weight.  Head and neck: no hearing loss, no  epistaxis, no photophobia or visual disturbance. Respiratory: No cough, shortness of breath or wheezing. Cardiovascular: No chest pain peripheral edema, palpitations. Gastrointestinal: No abdominal distention, no abdominal pain, no change in bowel habits hematochezia or melena. Genitourinary: No dysuria, no frequency, no urgency, no nocturia. Musculoskeletal:No arthralgias, no back pain, no gait disturbance or myalgias. Neurological: No dizziness, no headaches, no numbness, no seizures, no syncope, no weakness, no tremors. Hematologic: No lymphadenopathy, no easy bruising. Psychiatric: No confusion, no hallucinations, no sleep disturbance.    Physical Exam: Filed Vitals:   08/29/13 1102  BP: 160/90  Pulse: 108   the general appearance reveals a well-developed well-nourished gentleman in no distress.The head and neck exam reveals pupils equal and reactive.  Extraocular movements are full.  There is no scleral icterus.  The mouth and pharynx are normal.  The neck is supple.  The carotids reveal no bruits.  The jugular venous pressure is normal.  The  thyroid is not enlarged.  There is no lymphadenopathy.  The chest is clear to percussion and auscultation.  There are no rales or rhonchi.  Expansion of the chest is symmetrical.  The precordium is quiet.  The pulse is irregularly irregular The first heart sound is normal.  The second heart sound  is physiologically split.  There is no murmur gallop rub or click.  There is no abnormal lift or heave.  The abdomen is soft and nontender.  The bowel sounds are normal.  The liver and spleen are not enlarged.  There are no abdominal masses.  There are no abdominal bruits.  Extremities reveal good pedal pulses.  There is no phlebitis or edema.  There is no cyanosis or clubbing.  Strength is normal and symmetrical in all extremities.  There is no lateralizing weakness.  There are no sensory deficits.  The skin is warm and dry.  There is no rash.   EKG shows  atrial fibrillation with rapid ventricular response of 108  Assessment / Plan: Continue same medication except increase Cardizem to 240 mg daily. Advised him to cut back on his beer consumption which is a significant factor in his atrial fibrillation. Recheck in 6 months for office visit and EKG

## 2013-08-29 NOTE — Assessment & Plan Note (Signed)
He has relatively few symptoms from his atrial fibrillation.  Some days he feels like he is back in sinus rhythm all together.  Other days he feels that his heart is more irregular.  He has not been having any symptoms of congestive heart failure or angina pectoris.  He has not had any TIA symptoms or stroke symptoms and he remains on Xarelto

## 2013-09-05 ENCOUNTER — Other Ambulatory Visit: Payer: Self-pay | Admitting: *Deleted

## 2013-09-05 DIAGNOSIS — I83893 Varicose veins of bilateral lower extremities with other complications: Secondary | ICD-10-CM

## 2013-10-08 ENCOUNTER — Encounter: Payer: Self-pay | Admitting: Vascular Surgery

## 2013-10-09 ENCOUNTER — Ambulatory Visit (HOSPITAL_COMMUNITY)
Admission: RE | Admit: 2013-10-09 | Discharge: 2013-10-09 | Disposition: A | Discharge status: Home / Self Care | Payer: BC Managed Care – PPO | Source: Ambulatory Visit | Attending: Vascular Surgery | Admitting: Vascular Surgery

## 2013-10-09 ENCOUNTER — Encounter: Payer: Self-pay | Admitting: Vascular Surgery

## 2013-10-09 ENCOUNTER — Ambulatory Visit (INDEPENDENT_AMBULATORY_CARE_PROVIDER_SITE_OTHER): Payer: Self-pay | Admitting: Vascular Surgery

## 2013-10-09 VITALS — BP 156/103 | HR 104 | Resp 16 | Ht 72.0 in | Wt 220.0 lb

## 2013-10-09 DIAGNOSIS — I83893 Varicose veins of bilateral lower extremities with other complications: Secondary | ICD-10-CM | POA: Insufficient documentation

## 2013-10-09 NOTE — Progress Notes (Signed)
Subjective:     Patient ID: Thomas Patterson, male   DOB: 1950-09-06, 63 y.o.   MRN: [id number]  HPI this 63 year old male was referred for painful varicosities and swelling in the right leg. He states this is been present for at least 10 years. He has no history of stasis ulcers although he did have a scab in this area and the right ankle which she was trying to remove and then he developed significant bleeding from a varix in the right ankle which required compression to stop. He has a history of DVT or thrombophlebitis. He has aching throbbing and burning discomfort in his painful varicosities which worsened as the day progresses. He has not worn elastic compression stockings. He does take anticoagulation-Xeralto for atrial fibrillation and is seen by Dr. Darcella Gasman.  Past Medical History  Diagnosis Date  . Paroxysmal atrial fibrillation   . Palpitations   . Hypertension   . LVH (left ventricular hypertrophy)     Mild per echo in 2011    History  Substance Use Topics  . Smoking status: Former Smoker    Quit date: 07/26/1974  . Smokeless tobacco: Not on file  . Alcohol Use: Yes    Family History  Problem Relation Age of Onset  . Heart failure Father   . Cardiomyopathy Father   . Atrial fibrillation Father     No Known Allergies  Current outpatient prescriptions:atorvastatin (LIPITOR) 20 MG tablet, Take 20 mg by mouth daily.  , Disp: , Rfl: ;  diltiazem (CARDIZEM CD) 240 MG 24 hr capsule, Take 1 capsule (240 mg total) by mouth daily., Disp: 90 capsule, Rfl: 3;  hydrochlorothiazide (HYDRODIURIL) 25 MG tablet, Take 25 mg by mouth daily.  , Disp: , Rfl: ;  losartan (COZAAR) 100 MG tablet, Take 100 mg by mouth daily., Disp: , Rfl:  XARELTO 20 MG TABS tablet, TAKE 1 TABLET (20 MG TOTAL) BY MOUTH DAILY., Disp: 90 tablet, Rfl: 0  BP 156/103  Pulse 104  Resp 16  Ht 6' (1.829 m)  Wt 220 lb (99.791 kg)  BMI 29.83 kg/m2  Body mass index is 29.83 kg/(m^2).           Review of  Systems denies chest pain, dyspnea on exertion, PND, orthopnea, hemoptysis, claudication. Does have A. fib. Other systems negative complete review of systems    Objective:   Physical Exam BP 156/103  Pulse 104  Resp 16  Ht 6' (1.829 m)  Wt 220 lb (99.791 kg)  BMI 29.83 kg/m2  Gen.-alert and oriented x3 in no apparent distress HEENT normal for age Lungs no rhonchi or wheezing Cardiovascular regular rhythm no murmurs carotid pulses 3+ palpable no bruits audible Abdomen soft nontender no palpable masses Musculoskeletal free of  major deformities Skin clear -no rashes Neurologic normal Lower extremities 3+ femoral and dorsalis pedis pulses palpable bilaterally with no edema on the left 1-2+ edema right distal calf and ankle. Bulging varicosities beginning in the right distal medial thigh extending into the medial calf down to the medial malleolus with extensive reticular network around the ankle but no active ulceration early hyperpigmentation.  Downward a venous duplex exam the right leg which are reviewed and interpreted. There is gross reflux throughout the right great saphenous system supplying these bulging varicosities with some deep venous reflux as well. Right small saphenous vein is unremarkable.       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 The symptoms are affecting his daily living    Plan:         #1 long leg elastic compression stockings 20-30 mm gradient #2 elevate legs as much as possible #3 ibuprofen daily on a regular basis for pain #4 return in 3 months-if no significant improvement then he will need laser ablation right great saphenous vein with greater than 20 stab phlebectomy of painful varicosities. Will also need to stop his Xeralto  7 days prior to procedure

## 2013-11-17 ENCOUNTER — Other Ambulatory Visit: Payer: Self-pay | Admitting: Cardiology

## 2013-11-21 ENCOUNTER — Other Ambulatory Visit: Payer: Self-pay | Admitting: Cardiology

## 2014-01-28 ENCOUNTER — Encounter: Payer: Self-pay | Admitting: Vascular Surgery

## 2014-01-29 ENCOUNTER — Ambulatory Visit (INDEPENDENT_AMBULATORY_CARE_PROVIDER_SITE_OTHER): Payer: BC Managed Care – PPO | Admitting: Vascular Surgery

## 2014-01-29 ENCOUNTER — Encounter: Payer: Self-pay | Admitting: Vascular Surgery

## 2014-01-29 VITALS — BP 163/90 | HR 95 | Resp 16 | Ht 72.0 in | Wt 213.0 lb

## 2014-01-29 DIAGNOSIS — I83893 Varicose veins of bilateral lower extremities with other complications: Secondary | ICD-10-CM

## 2014-01-29 NOTE — Progress Notes (Signed)
Subjective:     Patient ID: Thomas Patterson, male   DOB: [birth year], 63 y.o.   MRN: [id number]  HPI this 63 year old male returns for continued followup regarding his severe venous insufficiency of the right leg. He tried long-leg elastic compression stockings 20-30 mm gradient as well as elevation ibuprofens had no improvement in his aching throbbing and burning discomfort in the right leg. He has painful varicosities and has had an episode of bleeding from prominent varix of the right ankle area. He has had no DVT or thrombophlebitis.  Past Medical History  Diagnosis Date  . Paroxysmal atrial fibrillation   . Palpitations   . Hypertension   . LVH (left ventricular hypertrophy)     Mild per echo in 2011    History  Substance Use Topics  . Smoking status: Former Smoker    Quit date: 07/26/1974  . Smokeless tobacco: Not on file  . Alcohol Use: Yes    Family History  Problem Relation Age of Onset  . Heart failure Father   . Cardiomyopathy Father   . Atrial fibrillation Father     No Known Allergies  Current outpatient prescriptions:atorvastatin (LIPITOR) 20 MG tablet, Take 20 mg by mouth daily.  , Disp: , Rfl: ;  diltiazem (CARDIZEM CD) 240 MG 24 hr capsule, Take 1 capsule (240 mg total) by mouth daily., Disp: 90 capsule, Rfl: 3;  hydrochlorothiazide (HYDRODIURIL) 25 MG tablet, Take 25 mg by mouth daily.  , Disp: , Rfl: ;  losartan (COZAAR) 100 MG tablet, Take 100 mg by mouth daily., Disp: , Rfl:  XARELTO 20 MG TABS tablet, TAKE 1 TABLET (20 MG TOTAL) BY MOUTH DAILY., Disp: 90 tablet, Rfl: 0  BP 163/90  Pulse 95  Resp 16  Ht 6' (1.829 m)  Wt 213 lb (96.616 kg)  BMI 28.88 kg/m2  Body mass index is 28.88 kg/(m^2).           Review of Systems denies chest pain, dyspnea on exertion, PND, orthopnea, hemoptysis     Objective:   Physical Exam BP 163/90  Pulse 95  Resp 16  Ht 6' (1.829 m)  Wt 213 lb (96.616 kg)  BMI 28.88 kg/m2  General well-developed well-nourished  male no apparent stress alert and oriented x3 Lungs rhonchi or wheezing Right leg with large bulging varicosities in the medial calf below the knee of the great saphenous system extending anteriorly and posteriorly with a large cluster of reticular telangiectasias in the medial malleolar area where previous hemorrhage occurred with chronic 1+ edema and early hyperpigmentation with no active ulcers     Assessment:     Gross reflux right great saphenous vein causing painful varicosities, chronic edema, 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

## 2014-02-03 ENCOUNTER — Other Ambulatory Visit: Payer: Self-pay | Admitting: Cardiology

## 2014-02-05 ENCOUNTER — Other Ambulatory Visit: Payer: Self-pay | Admitting: *Deleted

## 2014-02-05 DIAGNOSIS — I83893 Varicose veins of bilateral lower extremities with other complications: Secondary | ICD-10-CM

## 2014-04-03 ENCOUNTER — Encounter: Payer: Self-pay | Admitting: Cardiology

## 2014-04-12 ENCOUNTER — Encounter: Payer: Self-pay | Admitting: Vascular Surgery

## 2014-04-15 ENCOUNTER — Encounter: Payer: Self-pay | Admitting: Vascular Surgery

## 2014-04-15 ENCOUNTER — Ambulatory Visit (INDEPENDENT_AMBULATORY_CARE_PROVIDER_SITE_OTHER): Payer: BC Managed Care – PPO | Admitting: Vascular Surgery

## 2014-04-15 VITALS — BP 192/115 | HR 98 | Resp 18 | Ht 71.0 in | Wt 210.0 lb

## 2014-04-15 DIAGNOSIS — I83893 Varicose veins of bilateral lower extremities with other complications: Secondary | ICD-10-CM

## 2014-04-15 NOTE — Progress Notes (Signed)
   Laser Ablation Procedure      Date: 04/15/2014    Thomas Patterson DOB:June 02, 1951  Consent signed: Yes  Surgeon:J.D. Kellie Simmering  Procedure: Laser Ablation: right Greater Saphenous Vein  BP 192/115  Pulse 98  Resp 18  Ht 5\' 11"  (1.803 m)  Wt 210 lb (95.255 kg)  BMI 29.30 kg/m2  Start time: 9:25   End time: 10:25  Tumescent Anesthesia: 410 cc 0.9% NaCl with 50 cc Lidocaine HCL with 1% Epi and 15 cc 8.4% NaHCO3  Local Anesthesia: 8 cc Lidocaine HCL and NaHCO3 (ratio 2:1)  Total energy: 2529, total pulses: 169, total time: 2:49     Stab Phlebectomy: >20 Sites: Calf and Ankle  Patient tolerated procedure well: Yes  Notes:   Description of Procedure:  After marking the course of the secondary varicosities, the patient was placed on the operating table in the supine position, and the right leg was prepped and draped in sterile fashion.   Local anesthetic was administered and under ultrasound guidance the saphenous vein was accessed with a micro needle and guide wire; then the mirco puncture sheath was place.  A guide wire was inserted saphenofemoral junction , followed by a 5 french sheath.  The position of the sheath and then the laser fiber below the junction was confirmed using the ultrasound.  Tumescent anesthesia was administered along the course of the saphenous vein using ultrasound guidance. The patient was placed in Trendelenburg position and protective laser glasses were placed on patient and staff, and the laser was fired at 15 watt pulsed mode advancing 1-2 mm per sec for a total of 2529 joules.   For stab phlebectomies, local anesthetic was administered at the previously marked varicosities, and tumescent anesthesia was administered around the vessels.  Greater than 20 stab wounds were made using the tip of an 11 blade. And using the vein hook, the phlebectomies were performed using a hemostat to avulse the varicosities.  Adequate hemostasis was achieved.     Steri strips  were applied to the stab wounds and ABD pads and thigh high compression stockings were applied.  Ace wrap bandages were applied over the phlebectomy sites and at the top of the saphenofemoral junction. Blood loss was less than 15 cc.  The patient ambulated out of the operating room having tolerated the procedure well.

## 2014-04-15 NOTE — Progress Notes (Signed)
Subjective:     Patient ID: Thomas Patterson, male   DOB: [birth year], 63 y.o.   MRN: [id number]  HPI this 63 year old male had laser ablation of the right great saphenous line performed under local tumescent anesthesia plus multiple stab phlebectomy of painful varicosities. A total of 2529 J of energy was utilized. He tolerated the procedure well.   Review of Systems     Objective:   Physical Exam BP 192/115  Pulse 98  Resp 18  Ht 5\' 11"  (1.803 m)  Wt 210 lb (95.255 kg)  BMI 29.30 kg/m2       Assessment:     Well-tolerated laser ablation right great saphenous vein with greater than 20 stab phlebectomy of painful varicosities    Plan:     Return in one week for venous duplex exam to confirm closure right great saphenous line Patient will then return for sclerotherapy

## 2014-04-16 ENCOUNTER — Telehealth: Payer: Self-pay | Admitting: *Deleted

## 2014-04-16 NOTE — Telephone Encounter (Signed)
Pt had no bleeding. Told him it would be ok to loosen and rewrap the ace bandages. He is at work today so I encouraged him to take it easy and to call if he has any concerns.

## 2014-04-22 ENCOUNTER — Encounter: Payer: Self-pay | Admitting: Vascular Surgery

## 2014-04-23 ENCOUNTER — Ambulatory Visit (HOSPITAL_COMMUNITY)
Admission: RE | Admit: 2014-04-23 | Discharge: 2014-04-23 | Disposition: A | Discharge status: Home / Self Care | Payer: BC Managed Care – PPO | Source: Ambulatory Visit | Attending: Vascular Surgery | Admitting: Vascular Surgery

## 2014-04-23 ENCOUNTER — Encounter: Payer: Self-pay | Admitting: Vascular Surgery

## 2014-04-23 ENCOUNTER — Ambulatory Visit (INDEPENDENT_AMBULATORY_CARE_PROVIDER_SITE_OTHER): Payer: BC Managed Care – PPO | Admitting: Vascular Surgery

## 2014-04-23 ENCOUNTER — Ambulatory Visit: Payer: BC Managed Care – PPO | Admitting: Vascular Surgery

## 2014-04-23 ENCOUNTER — Encounter (HOSPITAL_COMMUNITY): Payer: BC Managed Care – PPO

## 2014-04-23 VITALS — BP 162/89 | HR 109 | Resp 18 | Ht 72.0 in | Wt 221.7 lb

## 2014-04-23 DIAGNOSIS — Z9889 Other specified postprocedural states: Secondary | ICD-10-CM | POA: Insufficient documentation

## 2014-04-23 DIAGNOSIS — I83893 Varicose veins of bilateral lower extremities with other complications: Secondary | ICD-10-CM | POA: Insufficient documentation

## 2014-04-23 NOTE — Progress Notes (Signed)
Here today for one-week followup after laser ablation of his right great saphenous vein and stab phlebectomy with Dr. Kellie Simmering. He is doing quite well the procedure. He reports mild tenderness over the ablation site. He has been compliant with his compression garment  On physical exam he has minimal bruising. Does have a typical thickening at the area of the phlebectomy with all steps healing quite nicely.  Venous duplex today was reviewed with the patient. This shows closure of the saphenous vein from the insertion site to just below the saphenofemoral junction and no evidence of DVT  Impression and plan successful treatment of right leg venous hypertension. The patient will wear his compression garments one additional week and when necessary basis. We'll see [place] again as

## 2014-04-24 ENCOUNTER — Ambulatory Visit: Payer: BC Managed Care – PPO | Admitting: Vascular Surgery

## 2014-04-24 ENCOUNTER — Encounter (HOSPITAL_COMMUNITY): Payer: BC Managed Care – PPO

## 2014-05-14 ENCOUNTER — Other Ambulatory Visit: Payer: Self-pay | Admitting: Cardiology

## 2014-05-24 ENCOUNTER — Ambulatory Visit: Payer: BC Managed Care – PPO | Admitting: *Deleted

## 2014-06-06 ENCOUNTER — Encounter (INDEPENDENT_AMBULATORY_CARE_PROVIDER_SITE_OTHER): Payer: BC Managed Care – PPO | Admitting: Physician Assistant

## 2014-06-06 ENCOUNTER — Encounter: Payer: Self-pay | Admitting: *Deleted

## 2014-06-06 DIAGNOSIS — I10 Essential (primary) hypertension: Secondary | ICD-10-CM

## 2014-06-06 NOTE — Progress Notes (Signed)
Patient left after checking in

## 2014-06-07 ENCOUNTER — Ambulatory Visit (INDEPENDENT_AMBULATORY_CARE_PROVIDER_SITE_OTHER): Payer: BC Managed Care – PPO | Admitting: *Deleted

## 2014-06-07 DIAGNOSIS — I780 Hereditary hemorrhagic telangiectasia: Secondary | ICD-10-CM

## 2014-06-07 DIAGNOSIS — I83891 Varicose veins of right lower extremities with other complications: Secondary | ICD-10-CM

## 2014-06-07 NOTE — Progress Notes (Signed)
X=.3% Sotradecol administered with a 27g butterfly.  Patient received a total of 6cc.  Injected areas where previous bleed occurred. Tol well. Easy access. Pt says his leg feels better since LA. Follow prn.  Photos: No.  Compression stockings applied: Yes.

## 2014-06-13 ENCOUNTER — Other Ambulatory Visit: Payer: Self-pay | Admitting: Cardiology

## 2014-07-02 ENCOUNTER — Ambulatory Visit (INDEPENDENT_AMBULATORY_CARE_PROVIDER_SITE_OTHER): Payer: BC Managed Care – PPO | Admitting: Nurse Practitioner

## 2014-07-02 ENCOUNTER — Encounter: Payer: Self-pay | Admitting: Nurse Practitioner

## 2014-07-02 VITALS — BP 152/92 | HR 106 | Ht 72.0 in | Wt 216.8 lb

## 2014-07-02 DIAGNOSIS — I4819 Other persistent atrial fibrillation: Secondary | ICD-10-CM

## 2014-07-02 DIAGNOSIS — I481 Persistent atrial fibrillation: Secondary | ICD-10-CM

## 2014-07-02 DIAGNOSIS — I10 Essential (primary) hypertension: Secondary | ICD-10-CM

## 2014-07-02 MED ORDER — RIVAROXABAN 20 MG PO TABS: ORAL_TABLET | ORAL | Status: DC

## 2014-07-02 MED ORDER — DILTIAZEM HCL ER COATED BEADS 300 MG PO CP24: 300.0000 mg | ORAL_CAPSULE | Freq: Every day | ORAL | Status: DC

## 2014-07-02 NOTE — Patient Instructions (Signed)
Your physician has recommended you make the following change in your medication:   START TAKING DILTIAZEM (CARDIZEM) 300 MG ONCE DAILY  REFILLED YOUR XARELTO AS REQUESTED      Your physician wants you to follow-up in: DR BRACKBILL IN 6 MONTHS  You will receive a reminder letter in the mail two months in advance. If you don't receive a letter, please call our office to schedule the follow-up appointment.

## 2014-07-02 NOTE — Progress Notes (Signed)
   Patient Name: Thomas Patterson Date of Encounter: 07/02/2014  Primary Care Provider:  Gennette Pac, MD Primary Cardiologist:  Vaughan Browner, MD   Patient Profile  63 y/o male w/ h/o persistent afib who presents for f/u.  Problem List   Past Medical History  Diagnosis Date  . Persistent atrial fibrillation     a. 2011 Echo: EF 55-60%, mild LVH.  Marland Kitchen Hypertension   . LVH (left ventricular hypertrophy)     a. 2011 Echo: mild LVH.  Marland Kitchen ETOH abuse    Past Surgical History  Procedure Laterality Date  . US echocardiography  03/18/2010    EF 55-60%    Allergies  No Known Allergies  HPI  63 y/o male with a h/o PAF that has become persistent.  He is generally asymptomatic though occasionally notes palpitations when rates are higher, though this is rare.  He is compliant with his meds and is on chronic xarelto.  He continues to use alcohol in a moderate fashion.  He denies chest pain, dyspnea, pnd, orthopnea, n, v, dizziness, syncope, edema, weight gain, or early satiety.   Home Medications  Prior to Admission medications   Medication Sig Start Date End Date Taking? Authorizing Provider  atorvastatin (LIPITOR) 20 MG tablet Take 20 mg by mouth daily.     Yes Historical Provider, MD  hydrochlorothiazide (HYDRODIURIL) 25 MG tablet Take 25 mg by mouth daily.     Yes Historical Provider, MD  losartan (COZAAR) 100 MG tablet Take 100 mg by mouth daily.   Yes Historical Provider, MD  rivaroxaban (XARELTO) 20 MG TABS tablet TAKE 1 TABLET (20 MG TOTAL) BY MOUTH DAILY. 07/02/14  Yes Rogelia Mire, NP  diltiazem (CARDIZEM CD) 300 MG 24 hr capsule Take 1 capsule (300 mg total) by mouth daily. 07/02/14   Rogelia Mire, NP    Review of Systems  Overall doing well with only rare palpitations.  All other systems reviewed and are otherwise negative except as noted above.  Physical Exam  Blood pressure 152/92, pulse 106, height 6' (1.829 m), weight 216 lb 12.8 oz (98.34 kg), SpO2 96 %.   General: Pleasant, NAD Psych: Normal affect. Neuro: Alert and oriented X 3. Moves all extremities spontaneously. HEENT: Normal  Neck: Supple without bruits or JVD. Lungs:  Resp regular and unlabored, CTA. Heart: IR, IR, no s3, s4, or murmurs. Abdomen: Soft, non-tender, non-distended, BS + x 4.  Extremities: No clubbing, cyanosis or edema. DP/PT/Radials 2+ and equal bilaterally.  Accessory Clinical Findings  ECG - afib, 96, no acute st/t changes.  Assessment & Plan  1.  Persistent Atrial Fibrillation:  Rate reasonably controlled and he is mostly asymptomatic, noting only occasional palpitations.  He is anticoagulated on xarelto and reports good compliance.  As his BP is up today, with moderate elevation of HR, I will increase his dilt to 300 mg daily.  2.  HTN:  BP up today.  He says that it runs in the 130's to 150's @ home.  I will increase dilt to 300 mg daily today.  3.  ETOH abuse:  Complete cessation advised.    4.  Dispo:  F/U Dr. Mare Ferrari in 6 mos or sooner as necessary.  Murray Hodgkins, NP 07/02/2014, 4:50 PM

## 2014-08-06 ENCOUNTER — Other Ambulatory Visit: Payer: Self-pay | Admitting: Cardiology

## 2014-08-19 ENCOUNTER — Other Ambulatory Visit: Payer: Self-pay | Admitting: Cardiology

## 2015-04-17 ENCOUNTER — Encounter: Payer: Self-pay | Admitting: Cardiology

## 2015-04-18 ENCOUNTER — Other Ambulatory Visit: Payer: Self-pay

## 2015-04-18 MED ORDER — RIVAROXABAN 20 MG PO TABS: ORAL_TABLET | ORAL | Status: DC

## 2015-04-28 ENCOUNTER — Ambulatory Visit: Payer: Self-pay | Admitting: Cardiology

## 2015-06-27 ENCOUNTER — Ambulatory Visit: Payer: Self-pay | Admitting: Cardiology

## 2015-07-13 ENCOUNTER — Other Ambulatory Visit: Payer: Self-pay | Admitting: Nurse Practitioner

## 2015-07-13 ENCOUNTER — Other Ambulatory Visit: Payer: Self-pay | Admitting: Cardiology

## 2015-08-21 ENCOUNTER — Encounter: Payer: Self-pay | Admitting: Cardiology

## 2015-08-21 ENCOUNTER — Ambulatory Visit (INDEPENDENT_AMBULATORY_CARE_PROVIDER_SITE_OTHER): Payer: BLUE CROSS/BLUE SHIELD | Admitting: Cardiology

## 2015-08-21 VITALS — BP 160/90 | HR 112 | Ht 71.75 in | Wt 219.4 lb

## 2015-08-21 DIAGNOSIS — I481 Persistent atrial fibrillation: Secondary | ICD-10-CM | POA: Diagnosis not present

## 2015-08-21 DIAGNOSIS — I10 Essential (primary) hypertension: Secondary | ICD-10-CM

## 2015-08-21 DIAGNOSIS — I4819 Other persistent atrial fibrillation: Secondary | ICD-10-CM

## 2015-08-21 MED ORDER — DILTIAZEM HCL ER COATED BEADS 300 MG PO CP24: ORAL_CAPSULE | ORAL | Status: DC

## 2015-08-21 MED ORDER — METOPROLOL SUCCINATE ER 50 MG PO TB24: 50.0000 mg | ORAL_TABLET | Freq: Every day | ORAL | Status: DC

## 2015-08-21 MED ORDER — RIVAROXABAN 20 MG PO TABS: 20.0000 mg | ORAL_TABLET | Freq: Every day | ORAL | Status: DC

## 2015-08-21 NOTE — Patient Instructions (Addendum)
Medication Instructions:  Your physician has recommended you make the following change in your medication:  1) START Toprol XL 50 mg daily  Labwork: None ordered  Testing/Procedures: Your physician has requested that you have an echocardiogram. Echocardiography is a painless test that uses sound waves to create images of your heart. It provides your doctor with information about the size and shape of your heart and how well your heart's chambers and valves are working. This procedure takes approximately one hour. There are no restrictions for this procedure.  Follow-Up: You have been referred to establish cardiology care with Dr. Debara Pickett in 2 months  If you need a refill on your cardiac medications before your next appointment, please call your pharmacy.  Thank you for choosing [facility]!!     Metoprolol extended-release tablets What is this medicine? METOPROLOL (me TOE proe lole) is a beta-blocker. Beta-blockers reduce the workload on the heart and help it to beat more regularly. This medicine is used to treat high blood pressure and to prevent chest pain. It is also used to after a heart attack and to prevent an additional heart attack from occurring. This medicine may be used for other purposes; ask your health care provider or pharmacist if you have questions. What should I tell my health care provider before I take this medicine? They need to know if you have any of these conditions: -diabetes -heart or vessel disease like slow heart rate, worsening heart failure, heart block, sick sinus syndrome or Raynaud's disease -kidney disease -liver disease -lung or breathing disease, like asthma or emphysema -pheochromocytoma -thyroid disease -an unusual or allergic reaction to metoprolol, other beta-blockers, medicines, foods, dyes, or preservatives -pregnant or trying to get pregnant -breast-feeding How should I use this medicine? Take this medicine by mouth with a glass of  water. Follow the directions on the prescription label. Do not crush or chew. Take this medicine with or immediately after meals. Take your doses at regular intervals. Do not take more medicine than directed. Do not stop taking this medicine suddenly. This could lead to serious heart-related effects. Talk to your pediatrician regarding the use of this medicine in children. While this drug may be prescribed for children as young as 6 years for selected conditions, precautions do apply. Overdosage: If you think you have taken too much of this medicine contact a poison control center or emergency room at once. NOTE: This medicine is only for you. Do not share this medicine with others. What if I miss a dose? If you miss a dose, take it as soon as you can. If it is almost time for your next dose, take only that dose. Do not take double or extra doses. What may interact with this medicine? This medicine may interact with the following medications: -certain medicines for blood pressure, heart disease, irregular heart beat -certain medicines for depression, like monoamine oxidase (MAO) inhibitors, fluoxetine, or paroxetine -clonidine -dobutamine -epinephrine -isoproterenol -reserpine This list may not describe all possible interactions. Give your health care provider a list of all the medicines, herbs, non-prescription drugs, or dietary supplements you use. Also tell them if you smoke, drink alcohol, or use illegal drugs. Some items may interact with your medicine. What should I watch for while using this medicine? Visit your doctor or health care professional for regular check ups. Contact your doctor right away if your symptoms worsen. Check your blood pressure and pulse rate regularly. Ask your health care professional what your blood pressure and pulse rate  should be, and when you should contact them. You may get drowsy or dizzy. Do not drive, use machinery, or do anything that needs mental alertness  until you know how this medicine affects you. Do not sit or stand up quickly, especially if you are an older patient. This reduces the risk of dizzy or fainting spells. Contact your doctor if these symptoms continue. Alcohol may interfere with the effect of this medicine. Avoid alcoholic drinks. What side effects may I notice from receiving this medicine? Side effects that you should report to your doctor or health care professional as soon as possible: -allergic reactions like skin rash, itching or hives -cold or numb hands or feet -depression -difficulty breathing -faint -fever with sore throat -irregular heartbeat, chest pain -rapid weight gain -swollen legs or ankles Side effects that usually do not require medical attention (report to your doctor or health care professional if they continue or are bothersome): -anxiety or nervousness -change in sex drive or performance -dry skin -headache -nightmares or trouble sleeping -short term memory loss -stomach upset or diarrhea -unusually tired This list may not describe all possible side effects. Call your doctor for medical advice about side effects. You may report side effects to FDA at 1-800-FDA-1088. Where should I keep my medicine? Keep out of the reach of children. Store at room temperature between 15 and 30 degrees C (59 and 86 degrees F). Throw away any unused medicine after the expiration date. NOTE: This sheet is a summary. It may not cover all possible information. If you have questions about this medicine, talk to your doctor, pharmacist, or health care provider.    2016, Elsevier/Gold Standard. (2013-03-16 14:41:37)

## 2015-08-21 NOTE — Progress Notes (Signed)
Cardiology Office Note   Date:  08/21/2015   ID:  Thomas Patterson, DOB 09-27-1950, MRN [id number]  PCP:  Gennette Pac, MD  Cardiologist: Darlin Coco MD  Chief Complaint  Patient presents with  . Follow-up    hypertension  Denies cp, sob, le edema, or claudication      History of Present Illness: Thomas Patterson is a 65 y.o. male who presents for a six-month follow-up visit.  Marland Kitchen Previously his atrial fibrillation was paroxysmal but recently it has become more established. He has not had an attempt at cardioversion. He does drink a moderate amount of beer on a regular basis we have talked about that on his previous visits. At his last visit we switched him from Pradaxa to Xarelto and he prefers Xarelto because of its side effect profile. The patient is disappointed that he has not been able to lose weight. He is a member of the gym but he has not been going to the gym lately.  He is considering retiring next summer after which she hopes to be able to get back into a more regular exercise program Is not been having any chest pain or increased shortness of breath.  He sleeps on one pillow.  He has not been experiencing any peripheral edema. His last echocardiogram was in 2012 and at that time his ejection fraction was 55-60%.  Past Medical History  Diagnosis Date  . Persistent atrial fibrillation ([place])     a. 2011 Echo: EF 55-60%, mild LVH.  Marland Kitchen Hypertension   . LVH (left ventricular hypertrophy)     a. 2011 Echo: mild LVH.  Marland Kitchen ETOH abuse     Past Surgical History  Procedure Laterality Date  . US echocardiography  03/18/2010    EF 55-60%     Current Outpatient Prescriptions  Medication Sig Dispense Refill  . atorvastatin (LIPITOR) 20 MG tablet Take 20 mg by mouth daily.      Marland Kitchen diltiazem (CARDIZEM CD) 300 MG 24 hr capsule TAKE 1 CAPSULE (300 MG TOTAL) BY MOUTH DAILY. 90 capsule 2  . hydrochlorothiazide (HYDRODIURIL) 25 MG tablet Take 25 mg by mouth daily.      Marland Kitchen  losartan (COZAAR) 100 MG tablet Take 100 mg by mouth daily.    . rivaroxaban (XARELTO) 20 MG TABS tablet Take 1 tablet (20 mg total) by mouth daily. 90 tablet 2  . metoprolol succinate (TOPROL-XL) 50 MG 24 hr tablet Take 1 tablet (50 mg total) by mouth daily. Take with or immediately following a meal. 30 tablet 3   No current facility-administered medications for this visit.    Allergies:   Review of patient's allergies indicates no known allergies.    Social History:  The patient  reports that he quit smoking about 41 years ago. He does not have any smokeless tobacco history on file. He reports that he drinks alcohol. He reports that he does not use illicit drugs.   Family History:  The patient's family history includes Atrial fibrillation in his father; Cardiomyopathy in his father; Heart failure in his father.    ROS:  Please see the history of present illness.   Otherwise, review of systems are positive for none.   All other systems are reviewed and negative.    PHYSICAL EXAM: VS:  BP 160/90 mmHg  Pulse 112  Ht 5' 11.75" (1.822 m)  Wt 219 lb 6.4 oz (99.519 kg)  BMI 29.98 kg/m2 , BMI Body mass index is 29.98 kg/(m^2). GEN: Well  nourished, well developed, in no acute distress HEENT: normal Neck: no JVD, carotid bruits, or masses Cardiac: Irregularly irregular; no murmurs, rubs, or gallops,no edema  Respiratory:  clear to auscultation bilaterally, normal work of breathing GI: soft, nontender, nondistended, + BS MS: no deformity or atrophy Skin: warm and dry, no rash Neuro:  Strength and sensation are intact Psych: euthymic mood, full affect   EKG:  EKG is ordered today. The ekg ordered today demonstrates true fibrillation with rapid ventricular response of 114 bpm.  Occasional PVC.   Recent Labs: No results found for requested labs within last 365 days.    Lipid Panel No results found for: CHOL, TRIG, HDL, CHOLHDL, VLDL, LDLCALC, LDLDIRECT    Wt Readings from Last 3  Encounters:  08/21/15 219 lb 6.4 oz (99.519 kg)  07/02/14 216 lb 12.8 oz (98.34 kg)  04/23/14 221 lb 11.2 oz (100.562 kg)        ASSESSMENT AND PLAN:  1.Chronic atrial fibrillation.  Heart rate is still elevated in the 110/m range.  2. HTN: Pressure is high today.  Dr. Rex Kras would like [place] to manage his high blood pressure as well as his atrial fibrillation.    3. ETOH abuse: He drinks excessive amounts of beer.  Contributing to his inability to lose weight.  Cessation advised   Current medicines are reviewed at length with the patient today.  The patient does not have concerns regarding medicines.  The following changes have been made:  For additional blood pressure and heart rate control, start metoprolol succinate 50 mg one daily  Labs/ tests ordered today include:   Orders Placed This Encounter  Procedures  . EKG 12-Lead  . ECHOCARDIOGRAM COMPLETE     Disposition:   We will update his echocardiogram.  His last one was 5 years ago.  We will add metoprolol succinate for additional heart rate control.  He will return in 2 months for follow-up office visit with Dr. Debara Pickett.  Berna Spare MD 08/21/2015 1:29 PM    [facility] [place], [place], [place]  [address] Phone: [phone]; Fax: [phone]

## 2015-08-27 ENCOUNTER — Telehealth: Payer: Self-pay | Admitting: Cardiology

## 2015-08-27 NOTE — Telephone Encounter (Signed)
Spoke with patient, last week was started on Toprol 50 mg daily  Stated that after taking he just felt "wiped out", took a nap during the day which he does not normally do Patient decreased his Toprol to 1/2 tablet daily and seems to be tolerating Blood pressures over the weekend [id number] with diastolic up to 90 1 to 2 times, heart rate 70-85 Will forward to  Dr. Mare Ferrari for review

## 2015-08-27 NOTE — Telephone Encounter (Signed)
Please call,concerning his Metoprolol. °

## 2015-08-27 NOTE — Telephone Encounter (Signed)
I would suggest continuing with taking just one half metoprolol daily

## 2015-08-27 NOTE — Telephone Encounter (Signed)
Advised patient and he will continue to monitor at home

## 2015-10-22 ENCOUNTER — Other Ambulatory Visit: Payer: Self-pay | Admitting: *Deleted

## 2015-10-22 DIAGNOSIS — I482 Chronic atrial fibrillation, unspecified: Secondary | ICD-10-CM

## 2015-10-28 ENCOUNTER — Other Ambulatory Visit: Payer: Self-pay | Admitting: Cardiology

## 2015-11-07 ENCOUNTER — Other Ambulatory Visit (HOSPITAL_COMMUNITY): Payer: BLUE CROSS/BLUE SHIELD

## 2015-11-07 ENCOUNTER — Ambulatory Visit (HOSPITAL_COMMUNITY): Payer: BLUE CROSS/BLUE SHIELD | Attending: Cardiovascular Disease

## 2015-11-07 ENCOUNTER — Other Ambulatory Visit: Payer: Self-pay

## 2015-11-07 DIAGNOSIS — I482 Chronic atrial fibrillation, unspecified: Secondary | ICD-10-CM

## 2015-11-07 DIAGNOSIS — I119 Hypertensive heart disease without heart failure: Secondary | ICD-10-CM | POA: Diagnosis not present

## 2015-11-07 DIAGNOSIS — Z8249 Family history of ischemic heart disease and other diseases of the circulatory system: Secondary | ICD-10-CM | POA: Insufficient documentation

## 2015-11-07 DIAGNOSIS — Z87891 Personal history of nicotine dependence: Secondary | ICD-10-CM | POA: Diagnosis not present

## 2015-11-07 DIAGNOSIS — I4891 Unspecified atrial fibrillation: Secondary | ICD-10-CM | POA: Diagnosis present

## 2015-11-13 ENCOUNTER — Ambulatory Visit (INDEPENDENT_AMBULATORY_CARE_PROVIDER_SITE_OTHER): Payer: BLUE CROSS/BLUE SHIELD | Admitting: Internal Medicine

## 2015-11-13 ENCOUNTER — Encounter: Payer: Self-pay | Admitting: Internal Medicine

## 2015-11-13 VITALS — BP 128/84 | HR 104 | Ht 71.75 in | Wt 220.0 lb

## 2015-11-13 DIAGNOSIS — I480 Paroxysmal atrial fibrillation: Secondary | ICD-10-CM | POA: Diagnosis not present

## 2015-11-13 DIAGNOSIS — I481 Persistent atrial fibrillation: Secondary | ICD-10-CM

## 2015-11-13 DIAGNOSIS — I10 Essential (primary) hypertension: Secondary | ICD-10-CM | POA: Diagnosis not present

## 2015-11-13 DIAGNOSIS — I4819 Other persistent atrial fibrillation: Secondary | ICD-10-CM

## 2015-11-13 MED ORDER — DILTIAZEM HCL ER COATED BEADS 300 MG PO TB24: 300.0000 mg | ORAL_TABLET | Freq: Every day | ORAL | Status: DC

## 2015-11-13 NOTE — Progress Notes (Signed)
OFFICE NOTE  Chief Complaint:  Establish cardiologist  Primary Care Physician: Gennette Pac, MD  HPI:  Thomas Patterson is a 65 y.o. male patient who was formerly followed by Dr. Mare Ferrari. Past medical history significant for alcohol abuse as well as atrial fibrillation which has been persistent. He has been in A. fib predominantly since at least 2011 or earlier. He has not had an attempt to get back into sinus rhythm. It seems that he is fairly asymptomatic with this. Recently he's had some faster rates and was placed on metoprolol for additional rate control. He said that he felt wiped out when taking the 50 mg tablet, but can take the 25 mg tablet without any problems. He is on Xarelto for anticoagulation. He reports very high cost of that medicine as well as Cardizem CD. Interestingly, he has Pharmacist, community for which she should be able to get Xarelto for very low cost if not free with a co-pay card. Mr. Baggarly had a recent echocardiogram which showed normal LV function and biatrial enlargement. He denies any chest pain with exertion. He reports is going to retire soon as interested in doing more exercise, cutting back on his alcohol working to try to reduce some of his blood pressure medication.  PMHx:  Past Medical History  Diagnosis Date  . Persistent atrial fibrillation ([place])     a. 2011 Echo: EF 55-60%, mild LVH.  Marland Kitchen Hypertension   . LVH (left ventricular hypertrophy)     a. 2011 Echo: mild LVH.  Marland Kitchen ETOH abuse     Past Surgical History  Procedure Laterality Date  . US echocardiography  03/18/2010    EF 55-60%    FAMHx:  Family History  Problem Relation Age of Onset  . Heart failure Father   . Cardiomyopathy Father   . Atrial fibrillation Father     SOCHx:   reports that he quit smoking about 41 years ago. He does not have any smokeless tobacco history on file. He reports that he drinks alcohol. He reports that he does not use illicit drugs.  ALLERGIES:  No  Known Allergies  ROS: Pertinent items noted in HPI and remainder of comprehensive ROS otherwise negative.  HOME MEDS: Current Outpatient Prescriptions  Medication Sig Dispense Refill  . atorvastatin (LIPITOR) 20 MG tablet Take 20 mg by mouth daily.      Marland Kitchen diltiazem (CARDIZEM CD) 300 MG 24 hr capsule TAKE 1 CAPSULE (300 MG TOTAL) BY MOUTH DAILY. 90 capsule 2  . hydrochlorothiazide (HYDRODIURIL) 25 MG tablet Take 25 mg by mouth daily.      Marland Kitchen losartan (COZAAR) 100 MG tablet Take 100 mg by mouth daily.    . metoprolol succinate (TOPROL-XL) 50 MG 24 hr tablet Take 50 mg by mouth as directed. Take 1/2 tablet by mouth daily    . rivaroxaban (XARELTO) 20 MG TABS tablet Take 1 tablet (20 mg total) by mouth daily. 90 tablet 2  . diltiazem (CARDIZEM LA) 300 MG 24 hr tablet Take 1 tablet (300 mg total) by mouth daily. 90 tablet 3   No current facility-administered medications for this visit.    LABS/IMAGING: No results found for this or any previous visit (from the past 48 hour(s)). No results found.  WEIGHTS: Wt Readings from Last 3 Encounters:  11/13/15 220 lb (99.791 kg)  08/21/15 219 lb 6.4 oz (99.519 kg)  07/02/14 216 lb 12.8 oz (98.34 kg)    VITALS: BP 128/84 mmHg  Pulse 104  Ht  5' 11.75" (1.822 m)  Wt 220 lb (99.791 kg)  BMI 30.06 kg/m2  EXAM: General appearance: alert and no distress Neck: no carotid bruit, no JVD and thyroid not enlarged, symmetric, no tenderness/mass/nodules Lungs: clear to auscultation bilaterally Heart: irregularly irregular rhythm Abdomen: soft, non-tender; bowel sounds normal; no masses,  no organomegaly Extremities: extremities normal, atraumatic, no cyanosis or edema Pulses: 2+ and symmetric Skin: Skin color, texture, turgor normal. No rashes or lesions Neurologic: Grossly normal Psych: Pleasant  EKG: Deferred  ASSESSMENT: 1. Likely permanent atrial fibrillation on Xarelto 2. CHADSVASC score of 2 3. Hypertension 4. EtOH abuse  PLAN: 1.    Thomas Patterson is had fairly good rate control on Cardizem and metoprolol. Cost is an issue with Cardizem CD however it should be cheaper for him to get the Cardizem LA tablets. Will make that switch today. We'll also provide a co-pay card which should provide Xarelto it very little if not 0 cost. Blood pressure tends to be pretty well controlled. Unfortunately continues to use alcohol however he did say today that he feels that he could stop if properly motivated. He is working on retirement and next few months and wants to be healthier try to cut back on some of his blood pressure medicines. Plan to see him back in 6 months to review his progress.  Pixie Casino, MD, [facility] Attending Cardiologist Dermott C Hilty 11/13/2015, 10:19 AM

## 2015-11-13 NOTE — Patient Instructions (Addendum)
Your physician wants you to follow-up in: 6 months with Dr. Debara Pickett. You will receive a reminder letter in the mail two months in advance. If you don't receive a letter, please call our office to schedule the follow-up appointment.  xarelto co-pay card and free 30 day card provided to patient

## 2016-01-15 ENCOUNTER — Other Ambulatory Visit: Payer: Self-pay | Admitting: *Deleted

## 2016-01-15 MED ORDER — METOPROLOL SUCCINATE ER 50 MG PO TB24: ORAL_TABLET | ORAL | Status: DC

## 2016-01-15 NOTE — Telephone Encounter (Signed)
Thomas Patterson  11/13/2015 9:45 AM  Office Visit  MRN:  [id number]   Description: Male DOB: 06-14-1951  Provider: Pixie Casino, MD  Department: [facility]       Vital Signs  Most recent update: 11/13/2015 9:40 AM by Harold Hedge, CMA    BP Pulse Ht Wt BMI    128/84 mmHg 104 5' 11.75" (1.822 m) 220 lb (99.791 kg) 30.06 kg/m2    Vitals History     Progress Notes      Pixie Casino, MD at 11/13/2015 10:19 AM     Status: Signed       Expand All Collapse All      OFFICE NOTE  Chief Complaint:  Establish cardiologist  Primary Care Physician: Gennette Pac, MD  HPI:  Thomas Patterson is a 65 y.o. male patient who was formerly followed by Dr. Mare Ferrari. Past medical history significant for alcohol abuse as well as atrial fibrillation which has been persistent. He has been in A. fib predominantly since at least 2011 or earlier. He has not had an attempt to get back into sinus rhythm. It seems that he is fairly asymptomatic with this. Recently he's had some faster rates and was placed on metoprolol for additional rate control. He said that he felt wiped out when taking the 50 mg tablet, but can take the 25 mg tablet without any problems. He is on Xarelto for anticoagulation. He reports very high cost of that medicine as well as Cardizem CD. Interestingly, he has Pharmacist, community for which she should be able to get Xarelto for very low cost if not free with a co-pay card. [name] had a recent echocardiogram which showed normal LV function and biatrial enlargement. He denies any chest pain with exertion. He reports is going to retire soon as interested in doing more exercise, cutting back on his alcohol working to try to reduce some of his blood pressure medication.       In the above HPI: it states that pt can take 25 mg of Metoprolol, will send in correct refill

## 2016-04-11 ENCOUNTER — Other Ambulatory Visit: Payer: Self-pay | Admitting: Internal Medicine

## 2016-04-13 ENCOUNTER — Telehealth: Payer: Self-pay | Admitting: *Deleted

## 2016-04-13 NOTE — Telephone Encounter (Signed)
PA for xarelto complete and approved.

## 2016-08-16 ENCOUNTER — Telehealth: Payer: Self-pay | Admitting: Internal Medicine

## 2016-08-16 ENCOUNTER — Ambulatory Visit (INDEPENDENT_AMBULATORY_CARE_PROVIDER_SITE_OTHER): Payer: Medicare Other | Admitting: Internal Medicine

## 2016-08-16 ENCOUNTER — Encounter: Payer: Self-pay | Admitting: Internal Medicine

## 2016-08-16 VITALS — BP 128/74 | HR 89 | Ht 71.0 in | Wt 218.0 lb

## 2016-08-16 DIAGNOSIS — F1010 Alcohol abuse, uncomplicated: Secondary | ICD-10-CM | POA: Diagnosis not present

## 2016-08-16 DIAGNOSIS — I480 Paroxysmal atrial fibrillation: Secondary | ICD-10-CM | POA: Diagnosis not present

## 2016-08-16 DIAGNOSIS — I10 Essential (primary) hypertension: Secondary | ICD-10-CM

## 2016-08-16 NOTE — Progress Notes (Signed)
OFFICE NOTE  Chief Complaint:  No complaints  Primary Care Physician: Gennette Pac, MD  HPI:  Thomas Patterson is a 66 y.o. male patient who was formerly followed by Dr. Mare Ferrari. Past medical history significant for alcohol abuse as well as atrial fibrillation which has been persistent. He has been in A. fib predominantly since at least 2011 or earlier. He has not had an attempt to get back into sinus rhythm. It seems that he is fairly asymptomatic with this. Recently he's had some faster rates and was placed on metoprolol for additional rate control. He said that he felt wiped out when taking the 50 mg tablet, but can take the 25 mg tablet without any problems. He is on Xarelto for anticoagulation. He reports very high cost of that medicine as well as Cardizem CD. Interestingly, he has Pharmacist, community for which she should be able to get Xarelto for very low cost if not free with a co-pay card. Mr. Dutkiewicz had a recent echocardiogram which showed normal LV function and biatrial enlargement. He denies any chest pain with exertion. He reports is going to retire soon as interested in doing more exercise, cutting back on his alcohol working to try to reduce some of his blood pressure medication.  08/16/2016  Mr. Housholder was seen today in follow-up. He continues to be very stable on his current medications. Blood pressure is well-controlled. His A. fib rate is controlled. Weight is within a couple pounds as his prior weight. He his working on cutting back on alcohol but that still is a problem for him. He did retire last summer and has a little more free time. I've encouraged him to schedule some routine exercise into his daily activities.  PMHx:  Past Medical History:  Diagnosis Date  . ETOH abuse   . Hypertension   . LVH (left ventricular hypertrophy)    a. 2011 Echo: mild LVH.  Marland Kitchen Persistent atrial fibrillation ([place])    a. 2011 Echo: EF 55-60%, mild LVH.    Past Surgical History:    Procedure Laterality Date  . US ECHOCARDIOGRAPHY  03/18/2010   EF 55-60%    FAMHx:  Family History  Problem Relation Age of Onset  . Heart failure Father   . Cardiomyopathy Father   . Atrial fibrillation Father     SOCHx:   reports that he quit smoking about 42 years ago. He has never used smokeless tobacco. He reports that he drinks alcohol. He reports that he does not use drugs.  ALLERGIES:  No Known Allergies  ROS: Pertinent items noted in HPI and remainder of comprehensive ROS otherwise negative.  HOME MEDS: Current Outpatient Prescriptions  Medication Sig Dispense Refill  . atorvastatin (LIPITOR) 20 MG tablet Take 20 mg by mouth daily.      Marland Kitchen diltiazem (CARDIZEM LA) 300 MG 24 hr tablet Take 300 mg by mouth daily.    . hydrochlorothiazide (HYDRODIURIL) 25 MG tablet Take 25 mg by mouth daily.      Marland Kitchen losartan (COZAAR) 100 MG tablet Take 100 mg by mouth daily.    . metoprolol succinate (TOPROL-XL) 50 MG 24 hr tablet TAKE 25 MG BY MOUTH DAILY 45 tablet 2  . XARELTO 20 MG TABS tablet TAKE 1 TABLET BY MOUTH EVERY DAY 90 tablet 0   No current facility-administered medications for this visit.     LABS/IMAGING: No results found for this or any previous visit (from the past 48 hour(s)). No results found.  WEIGHTS: Wt  Readings from Last 3 Encounters:  08/16/16 218 lb (98.9 kg)  11/13/15 220 lb (99.8 kg)  08/21/15 219 lb 6.4 oz (99.5 kg)    VITALS: BP 128/74   Pulse 89   Ht 5\' 11"  (1.803 m)   Wt 218 lb (98.9 kg)   BMI 30.40 kg/m   EXAM: General appearance: alert and no distress Neck: no carotid bruit, no JVD and thyroid not enlarged, symmetric, no tenderness/mass/nodules Lungs: clear to auscultation bilaterally Heart: irregularly irregular rhythm Abdomen: soft, non-tender; bowel sounds normal; no masses,  no organomegaly Extremities: extremities normal, atraumatic, no cyanosis or edema Pulses: 2+ and symmetric Skin: Skin color, texture, turgor normal. No rashes  or lesions Neurologic: Grossly normal Psych: Pleasant  EKG: Atrial fibrillation at 89  ASSESSMENT: 1. Likely permanent atrial fibrillation on Xarelto 2. CHADSVASC score of 2 3. Hypertension 4. EtOH abuse  PLAN: 1.   Mr. Baptist seems to be doing well on his current medications. He continues to work on alcohol abstinence. Blood pressure is well-controlled. He remains on Xarelto and she is in likely permanent atrial fibrillation. Overall encouraged him to get some more scheduled exercise that he's retired. We'll plan to see him back in one year sooner as necessary.  Pixie Casino, MD, [facility] Attending Cardiologist [place] C Hilty 08/16/2016, 9:41 AM

## 2016-08-16 NOTE — Telephone Encounter (Signed)
Request for Xarelto hold for upcoming colonoscopy 08/19/16. Routed to Dr. Debara Pickett to address.

## 2016-08-16 NOTE — Telephone Encounter (Signed)
Instructions relayed for med hold - patient voiced understanding and thanks.

## 2016-08-16 NOTE — Telephone Encounter (Signed)
That should be fine - he would take the Xarelto tonight and then hold it for the next 2 nights, prior to the procedure this Thursday 1/25. Resume >24 hours afterward.  Dr. Lemmie Evens

## 2016-08-16 NOTE — Telephone Encounter (Signed)
Patient states he saw Dr. Debara Pickett this morning and forgot to ask about stopping his Xarelto for his upcoming colonoscopy this Thursday 08/19/16

## 2016-08-16 NOTE — Patient Instructions (Signed)
Continue same medications.   Your physician wants you to follow-up in: 1 year.  You will receive a reminder letter in the mail two months in advance. If you don't receive a letter, please call our office to schedule the follow-up appointment.  

## 2016-09-04 ENCOUNTER — Other Ambulatory Visit: Payer: Self-pay | Admitting: Internal Medicine

## 2016-09-08 NOTE — Telephone Encounter (Signed)
Pateint to fax copy of BMET, LFT and CBC to Office ASAP. He has enough Xarelto supply for another week.

## 2016-09-10 ENCOUNTER — Other Ambulatory Visit: Payer: Self-pay | Admitting: Internal Medicine

## 2016-09-10 NOTE — Telephone Encounter (Signed)
New Message    *STAT* If patient is at the pharmacy, call can be transferred to refill team.   1. Which medications need to be refilled? (please list name of each medication and dose if known) Xarelto  2. Which pharmacy/location (including street and city if local pharmacy) is medication to be sent to? [facility]  3. Do they need a 30 day or 90 day supply? [place]

## 2016-09-10 NOTE — Telephone Encounter (Signed)
Received labs from [place] at [facility].  Dose appropriate and refilled

## 2016-09-17 ENCOUNTER — Other Ambulatory Visit: Payer: Self-pay | Admitting: *Deleted

## 2016-09-17 DIAGNOSIS — E7800 Pure hypercholesterolemia, unspecified: Secondary | ICD-10-CM

## 2016-09-17 MED ORDER — ATORVASTATIN CALCIUM 40 MG PO TABS: 40.0000 mg | ORAL_TABLET | Freq: Every day | ORAL | 5 refills | Status: DC

## 2016-09-25 ENCOUNTER — Encounter: Payer: Self-pay | Admitting: Internal Medicine

## 2017-01-21 ENCOUNTER — Other Ambulatory Visit: Payer: Self-pay | Admitting: Internal Medicine

## 2017-01-21 NOTE — Telephone Encounter (Signed)
Rx(s) sent to pharmacy electronically.  

## 2017-03-08 ENCOUNTER — Other Ambulatory Visit: Payer: Self-pay | Admitting: Internal Medicine

## 2017-03-14 ENCOUNTER — Other Ambulatory Visit: Payer: Self-pay | Admitting: Internal Medicine

## 2017-03-14 NOTE — Telephone Encounter (Signed)
Last CBC & BMET complete 1 year ago. Need to complete blood work prior to next refill authorization.

## 2017-04-15 ENCOUNTER — Other Ambulatory Visit: Payer: Self-pay | Admitting: Internal Medicine

## 2017-04-15 ENCOUNTER — Other Ambulatory Visit: Payer: Self-pay

## 2017-04-15 DIAGNOSIS — E7800 Pure hypercholesterolemia, unspecified: Secondary | ICD-10-CM

## 2017-04-15 MED ORDER — ATORVASTATIN CALCIUM 40 MG PO TABS: 40.0000 mg | ORAL_TABLET | Freq: Every day | ORAL | 3 refills | Status: DC

## 2017-04-15 MED ORDER — ATORVASTATIN CALCIUM 40 MG PO TABS: 40.0000 mg | ORAL_TABLET | Freq: Every day | ORAL | Status: DC

## 2017-04-18 ENCOUNTER — Other Ambulatory Visit: Payer: Self-pay | Admitting: Internal Medicine

## 2017-04-18 DIAGNOSIS — E7800 Pure hypercholesterolemia, unspecified: Secondary | ICD-10-CM

## 2017-06-07 ENCOUNTER — Other Ambulatory Visit: Payer: Self-pay | Admitting: Internal Medicine

## 2017-07-12 ENCOUNTER — Other Ambulatory Visit: Payer: Self-pay | Admitting: Internal Medicine

## 2017-07-12 DIAGNOSIS — E7800 Pure hypercholesterolemia, unspecified: Secondary | ICD-10-CM

## 2017-07-27 ENCOUNTER — Other Ambulatory Visit: Payer: Self-pay | Admitting: Internal Medicine

## 2017-08-04 ENCOUNTER — Other Ambulatory Visit: Payer: Self-pay

## 2017-08-04 DIAGNOSIS — E7800 Pure hypercholesterolemia, unspecified: Secondary | ICD-10-CM

## 2017-08-04 MED ORDER — ATORVASTATIN CALCIUM 40 MG PO TABS: 40.0000 mg | ORAL_TABLET | Freq: Every day | ORAL | 0 refills | Status: DC

## 2017-08-04 NOTE — Telephone Encounter (Signed)
Rx(s) sent to pharmacy electronically.  

## 2017-08-05 ENCOUNTER — Other Ambulatory Visit: Payer: Self-pay

## 2017-09-05 ENCOUNTER — Other Ambulatory Visit: Payer: Self-pay | Admitting: Internal Medicine

## 2017-09-21 ENCOUNTER — Telehealth: Payer: Self-pay | Admitting: Internal Medicine

## 2017-09-21 NOTE — Telephone Encounter (Signed)
Received office visit  From [facility] on 09/21/17, Appt 08/20/17 [place].

## 2017-09-21 NOTE — Telephone Encounter (Signed)
   [facility] Pre-operative Risk Assessment    Request for surgical clearance:  1. What type of surgery is being performed? Prostate ultrasound biopsy   2. When is this surgery scheduled? October 17, 2017   3. What type of clearance is required (medical clearance vs. Pharmacy clearance to hold med vs. Both)? Both   4. Are there any medications that need to be held prior to surgery and how long? Xarelto - 3 days prior   5. Practice name and name of physician performing surgery? Dr. Louis Meckel @ [facility]   6. What is your office phone and fax number? (p) [phone]  (f) [phone]   7. Anesthesia type (None, local, MAC, general) ? Not specified    Fidel Levy 09/21/2017, 7:45 AM  _________________________________________________________________   (provider comments below)

## 2017-09-21 NOTE — Telephone Encounter (Signed)
D/w pt appt made for clearance 10-06-17 w/Thomas Patterson

## 2017-09-21 NOTE — Telephone Encounter (Signed)
   Primary Cardiologist:Kenneth C Hilty, MD  Chart reviewed as part of pre-operative protocol coverage. Because of Thomas Patterson past medical history and time since last visit, he/she will require a follow-up visit in order to better assess preoperative cardiovascular risk. Not seen since 1.22.2018.   Pre-op covering staff: - Please schedule appointment and call patient to inform them. - Please contact requesting surgeon's office via preferred method (i.e, phone, fax) to inform them of need for appointment prior to surgery.  Thomas Patterson. Nini Cavan DNP, ANP. AACC. 09/21/2017, 4:06 PM

## 2017-09-21 NOTE — Telephone Encounter (Signed)
Forwarded to requesting party via EPIC 

## 2017-10-06 ENCOUNTER — Ambulatory Visit: Payer: Medicare Other | Admitting: Physician Assistant

## 2017-10-06 ENCOUNTER — Encounter: Payer: Self-pay | Admitting: Physician Assistant

## 2017-10-06 VITALS — BP 132/76 | HR 94 | Ht 71.0 in | Wt 223.2 lb

## 2017-10-06 DIAGNOSIS — Z01818 Encounter for other preprocedural examination: Secondary | ICD-10-CM

## 2017-10-06 DIAGNOSIS — I482 Chronic atrial fibrillation, unspecified: Secondary | ICD-10-CM

## 2017-10-06 DIAGNOSIS — I10 Essential (primary) hypertension: Secondary | ICD-10-CM | POA: Diagnosis not present

## 2017-10-06 DIAGNOSIS — F1010 Alcohol abuse, uncomplicated: Secondary | ICD-10-CM

## 2017-10-06 MED ORDER — RIVAROXABAN 20 MG PO TABS: 20.0000 mg | ORAL_TABLET | Freq: Every day | ORAL | 3 refills | Status: DC

## 2017-10-06 NOTE — Progress Notes (Signed)
Cardiology Office Note    Date:  10/06/2017   ID:  Thomas Patterson, DOB [birth year], MRN [id number]  PCP:  Thomas Fess, MD  Cardiologist:  Dr. Debara Patterson   Chief Complaint  Patient presents with  . Medical Clearance    Pt has no actiive complaints     History of Present Illness:  Thomas Patterson is a 67 y.o. male with PMH of EtOH abuse, LVH, HTN and permanent atrial fibrillation.  He has been in atrial fibrillation since at least 2011, there is no current attempt to get him back to rhythm.  He is also on Xarelto for anticoagulation.  Last echocardiogram obtained on 11/07/2015 showed EF 55-60%, mild biatrial dilatation, peak PA pressure 35 mmHg.  Patient presents today for preoperative clearance prior to the prostate biopsy by Dr. Louis Patterson of [facility].  He does not have history of coronary artery disease.  He walked on the treadmill at least 3 times a week.  Prior to coming to the clinic today, he was able to run on the treadmill for at least 3 miles.  He denies any recent exertional chest pain or shortness of breath.  He can climb up at least 2 flight of stairs or walk 4 blocks away from his house and back without any exertional symptoms.  Since he is able to complete at least 4 METS of activity without any issue, he is cleared to proceed with surgery.  He will need to hold the Xarelto for 3 days prior to the surgery and restart as soon as possible.   Past Medical History:  Diagnosis Date  . ETOH abuse   . Hypertension   . LVH (left ventricular hypertrophy)    a. 2011 Echo: mild LVH.  Marland Kitchen Persistent atrial fibrillation ([place])    a. 2011 Echo: EF 55-60%, mild LVH.    Past Surgical History:  Procedure Laterality Date  . US ECHOCARDIOGRAPHY  03/18/2010   EF 55-60%    Current Medications: Outpatient Medications Prior to Visit  Medication Sig Dispense Refill  . atorvastatin (LIPITOR) 40 MG tablet Take 1 tablet (40 mg total) by mouth daily. NEEDS APPOINTMENT FOR FUTURE  REFILLS 90 tablet 0  . hydrochlorothiazide (HYDRODIURIL) 25 MG tablet Take 25 mg by mouth daily.      Marland Kitchen losartan (COZAAR) 100 MG tablet Take 100 mg by mouth daily.    Marland Kitchen MATZIM LA 300 MG 24 hr tablet TAKE 1 TABLET (300 MG TOTAL) BY MOUTH DAILY. 90 tablet 1  . metoprolol succinate (TOPROL-XL) 50 MG 24 hr tablet TAKE 1/2 TABLET BY MOUTH EVERY DAY 45 tablet 2  . rivaroxaban (XARELTO) 20 MG TABS tablet Take 1 tablet (20 mg total) by mouth daily with supper. 90 tablet 1  . diltiazem (CARDIZEM LA) 300 MG 24 hr tablet Take 300 mg by mouth daily.     No facility-administered medications prior to visit.      Allergies:   Patient has no known allergies.   Social History   Socioeconomic History  . Marital status: Single    Spouse name: None  . Number of children: None  . Years of education: None  . Highest education level: None  Social Needs  . Financial resource strain: None  . Food insecurity - worry: None  . Food insecurity - inability: None  . Transportation needs - medical: None  . Transportation needs - non-medical: None  Occupational History  . None  Tobacco Use  . Smoking status: Former Smoker  Last attempt to quit: 07/26/1974    Years since quitting: 43.2  . Smokeless tobacco: Never Used  Substance and Sexual Activity  . Alcohol use: Yes  . Drug use: No  . Sexual activity: Yes  Other Topics Concern  . None  Social History Narrative  . None     Family History:  The patient's family history includes Atrial fibrillation in his father; Cardiomyopathy in his father; Heart failure in his father.   ROS:   Please see the history of present illness.    ROS All other systems reviewed and are negative.   PHYSICAL EXAM:   VS:  BP 132/76 (BP Location: Right Arm, Patient Position: Sitting, Cuff Size: Normal)   Pulse 94   Ht 5\' 11"  (1.803 m)   Wt 223 lb 3.2 oz (101.2 kg)   BMI 31.13 kg/m    GEN: Well nourished, well developed, in no acute distress  HEENT: normal  Neck: no  JVD, carotid bruits, or masses Cardiac: RRR; no murmurs, rubs, or gallops,no edema  Respiratory:  clear to auscultation bilaterally, normal work of breathing GI: soft, nontender, nondistended, + BS MS: no deformity or atrophy  Skin: warm and dry, no rash Neuro:  Alert and Oriented x 3, Strength and sensation are intact Psych: euthymic mood, full affect  Wt Readings from Last 3 Encounters:  10/06/17 223 lb 3.2 oz (101.2 kg)  08/16/16 218 lb (98.9 kg)  11/13/15 220 lb (99.8 kg)      Studies/Labs Reviewed:   EKG:  EKG is ordered today.  The ekg ordered today demonstrates atrial fibrillation, heart rate is 94  Recent Labs: No results found for requested labs within last 8760 hours.   Lipid Panel No results found for: CHOL, TRIG, HDL, CHOLHDL, VLDL, LDLCALC, LDLDIRECT  Additional studies/ records that were reviewed today include:   Echo 11/07/2015 LV EF: 55% -   60%  Study Conclusions  - Left ventricle: The cavity size was normal. Wall thickness was   normal. Systolic function was normal. The estimated ejection   fraction was in the range of 55% to 60%. Wall motion was normal;   there were no regional wall motion abnormalities. The tissue   Doppler parameters were normal. - Left atrium: The atrium was mildly dilated. - Right ventricle: The cavity size was mildly dilated. - Right atrium: The atrium was mildly dilated. - Atrial septum: No defect or patent foramen ovale was identified. - Pulmonary arteries: Systolic pressure was mildly increased. PA   peak pressure: 35 mm Hg (S).   ASSESSMENT:    1. Pre-operative clearance   2. Chronic atrial fibrillation (HCC)   3. ETOH abuse   4. Essential hypertension      PLAN:  In order of problems listed above:  1. Preoperative clearance: Pending prostate biopsy by Dr. Louis Patterson of [facility].  Patient is able to complete at least 4 METs of activity.  He may hold Xarelto for 3 days prior to the surgery  and restart as soon as possible afterward at the discretion of the surgeon.  2. Chronic atrial fibrillation on Xarelto: He stays in atrial fibrillation at this point.  Heart rate is controlled on the 300 mg daily of long-acting diltiazem. He is doing well on this dose, therefore I did not change it.   3. EtOH abuse: He continued to drink at least 6 beers per week, he is aware the close correlation between alcohol and atrial fibrillation.  We discussed the need  to cut back or quit drinking.  4. Hypertension: Blood pressure well controlled    Medication Adjustments/Labs and Tests Ordered: Current medicines are reviewed at length with the patient today.  Concerns regarding medicines are outlined above.  Medication changes, Labs and Tests ordered today are listed in the Patient Instructions below. Patient Instructions  You are cleared for surgery!  HOLD Xarelto 3 days PRIOR to surgery. RESTART as soon as possible after surgery.  Almyra Deforest, [place] recommends that you schedule a follow-up appointment in 12 months with Dr Thomas Patterson. You will receive a reminder letter in the mail two months in advance. If you don't receive a letter, please call our office to schedule the follow-up appointment.  If you need a refill on your cardiac medications before your next appointment, please call your pharmacy.    Hilbert Corrigan, [place]  10/06/2017 [facility] [place], [place], [place]  [address] Phone: [phone]; Fax: [phone]

## 2017-10-06 NOTE — Patient Instructions (Signed)
You are cleared for surgery!  HOLD Xarelto 3 days PRIOR to surgery. RESTART as soon as possible after surgery.  Almyra Deforest, [place] recommends that you schedule a follow-up appointment in 12 months with Dr Debara Pickett. You will receive a reminder letter in the mail two months in advance. If you don't receive a letter, please call our office to schedule the follow-up appointment.  If you need a refill on your cardiac medications before your next appointment, please call your pharmacy.

## 2017-10-08 ENCOUNTER — Encounter: Payer: Self-pay | Admitting: Physician Assistant

## 2017-10-17 HISTORY — PX: TRANSRECTAL ULTRASOUND: SHX5146

## 2017-10-17 HISTORY — PX: PROSTATE BIOPSY: SHX241

## 2017-10-26 ENCOUNTER — Encounter: Payer: Self-pay | Admitting: Radiation Oncology

## 2017-11-14 ENCOUNTER — Other Ambulatory Visit: Payer: Self-pay | Admitting: Internal Medicine

## 2017-11-14 ENCOUNTER — Encounter: Payer: Self-pay | Admitting: Radiation Oncology

## 2017-11-14 ENCOUNTER — Ambulatory Visit
Admission: RE | Admit: 2017-11-14 | Discharge: 2017-11-14 | Disposition: A | Discharge status: Home / Self Care | Payer: Medicare Other | Source: Ambulatory Visit | Attending: Radiation Oncology | Admitting: Radiation Oncology

## 2017-11-14 ENCOUNTER — Encounter: Payer: Self-pay | Admitting: Medical Oncology

## 2017-11-14 ENCOUNTER — Other Ambulatory Visit: Payer: Self-pay

## 2017-11-14 VITALS — BP 146/84 | HR 93 | Temp 98.4°F | Resp 18 | Wt 227.6 lb

## 2017-11-14 DIAGNOSIS — I4891 Unspecified atrial fibrillation: Secondary | ICD-10-CM | POA: Insufficient documentation

## 2017-11-14 DIAGNOSIS — Z806 Family history of leukemia: Secondary | ICD-10-CM | POA: Diagnosis not present

## 2017-11-14 DIAGNOSIS — Z79899 Other long term (current) drug therapy: Secondary | ICD-10-CM | POA: Insufficient documentation

## 2017-11-14 DIAGNOSIS — I517 Cardiomegaly: Secondary | ICD-10-CM | POA: Insufficient documentation

## 2017-11-14 DIAGNOSIS — Z800 Family history of malignant neoplasm of digestive organs: Secondary | ICD-10-CM | POA: Diagnosis not present

## 2017-11-14 DIAGNOSIS — Z87891 Personal history of nicotine dependence: Secondary | ICD-10-CM | POA: Diagnosis not present

## 2017-11-14 DIAGNOSIS — F1010 Alcohol abuse, uncomplicated: Secondary | ICD-10-CM | POA: Insufficient documentation

## 2017-11-14 DIAGNOSIS — I10 Essential (primary) hypertension: Secondary | ICD-10-CM | POA: Diagnosis not present

## 2017-11-14 DIAGNOSIS — Z7901 Long term (current) use of anticoagulants: Secondary | ICD-10-CM | POA: Insufficient documentation

## 2017-11-14 DIAGNOSIS — E7800 Pure hypercholesterolemia, unspecified: Secondary | ICD-10-CM

## 2017-11-14 DIAGNOSIS — C61 Malignant neoplasm of prostate: Secondary | ICD-10-CM | POA: Insufficient documentation

## 2017-11-14 DIAGNOSIS — Z85828 Personal history of other malignant neoplasm of skin: Secondary | ICD-10-CM | POA: Diagnosis not present

## 2017-11-14 HISTORY — DX: Unspecified malignant neoplasm of skin, unspecified: C44.90

## 2017-11-14 HISTORY — DX: Malignant neoplasm of prostate: C61
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
## 2017-11-14 NOTE — Progress Notes (Signed)
See progress note under physician encounter. 

## 2017-11-14 NOTE — Progress Notes (Signed)
GU Location of Tumor / Histology: prostatic adenocarcinoma  If Prostate Cancer, Gleason Score is (3 + 4) and PSA is (4.58). Prostate volume: 49 grams  Thomas Patterson presented months ago with signs/symptoms of: Dr. Hulan Fess, MD referred the patient to Dr. Louis Meckel for further evaluation of an elevated PSA.   09/13/17 PSA 4.56 06/14/2017 PSA 5.76 04/27/2017 PSA 4.12 03/01/2016 PSA 2.66 10/14/2011 PSA 1.61 06/12/2008 PSA 1.58  Biopsies revealed:    Past/Anticipated interventions by urology, if any:  10/17/2017: TRANSRECTAL ULTRASOUND OF THE PROSTATE   Past/Anticipated interventions by medical oncology, if any: None  Weight changes, if any: No  Bowel/Bladder complaints, if any: Denies dysuria, hematuria, urinary leakage or incontinence I-PSS: 7   Nausea/Vomiting, if any: No  Pain issues, if any:  No  SAFETY ISSUES:  Prior radiation? No  Pacemaker/ICD? No  Possible current pregnancy? No  Is the patient on methotrexate? No  Current Complaints / other details: 67 year old male. Married. Former smoker. Hx of a fib. Resides in [place] with wife. Both are retired. Have two daughters. One grandchild with one on the way. Other daughter getting married in December.

## 2017-11-14 NOTE — Progress Notes (Signed)
Introduced myself to Thomas Patterson and his wife as the prostate nurse navigator and my role. He has had  PSA that has been slowly rising since 2009. He states that Dr. Louis Meckel has encouraged surgery due to age but he would like to learn about his radiation options before making a decision. I gave him my business card and asked him to call with questions or concerns. He voiced understanding.

## 2017-11-15 NOTE — Telephone Encounter (Signed)
REFILL 

## 2017-11-18 ENCOUNTER — Ambulatory Visit: Payer: Medicare Other | Admitting: Internal Medicine

## 2017-11-18 NOTE — Addendum Note (Signed)
Encounter addended by: Tyler Pita, MD on: 11/18/2017 5:44 PM  Actions taken: Medication List reviewed, Allergies reviewed, Problem List reviewed

## 2017-11-25 ENCOUNTER — Other Ambulatory Visit: Payer: Self-pay | Admitting: Urology

## 2017-11-25 ENCOUNTER — Telehealth: Payer: Self-pay | Admitting: Internal Medicine

## 2017-11-25 NOTE — Telephone Encounter (Signed)
   [facility] Pre-operative Risk Assessment    Request for surgical clearance:  1. What type of surgery is being performed? None - Biofeedback (need clearance d/t history of AFib)   2. When is this surgery scheduled? TBD   3. What type of clearance is required (medical clearance vs. Pharmacy clearance to hold med vs. Both)? Medical   4. Are there any medications that need to be held prior to surgery and how long? None    5. Practice name and name of physician performing surgery? Orlinda Blalock PT with [facility]   6. What is your office phone number: [phone] (ext: [phone] for Orlinda Blalock PT)    7.   What is your office fax number: [phone]  8.   Anesthesia type (None, local, MAC, general) ? None    Thomas Patterson M 11/25/2017, 4:24 PM  _________________________________________________________________   (provider comments below)

## 2017-11-25 NOTE — Telephone Encounter (Signed)
New Message:          [facility] Pre-operative Risk Assessment    Request for surgical clearance:  1. What type of surgery is being performed? Robotic laparoscopic  prostatectomy  2. When is this surgery scheduled? TBD   3. What type of clearance is required (medical clearance vs. Pharmacy clearance to hold med vs. Both)? Both  4. Are there any medications that need to be held prior to surgery and how long?rivaroxaban (XARELTO) 20 MG TABS tablet  5. Practice name and name of physician performing surgery? Dr. Louis Meckel [facility]  6. What is your office phone number [phone]   7.   What is your office fax number [phone]  8.   Anesthesia type (None, local, MAC, general) ? General   Thomas Patterson 11/25/2017, 8:12 AM  _________________________________________________________________   (provider comments below)

## 2017-11-25 NOTE — Telephone Encounter (Signed)
Pt was cleared on OV 10/11/17 will refax.

## 2017-11-28 NOTE — Telephone Encounter (Signed)
   Primary Cardiologist: Pixie Casino, MD  Chart reviewed as part of pre-operative protocol coverage.   Per patient, he is having Robotic Proctectomy on 12/30/17 and needs to hold Xarelto 3 days prior as he holded prior to biopsy 09/2017.  Given past medical history and time since last visit, based on ACC/AHA guidelines, Thomas Patterson would be at acceptable risk for the planned procedure without further cardiovascular testing.  Will send to pharmacy to review anticoagulation as protocol.   [place], [place] 11/28/2017, 3:10 PM

## 2017-11-28 NOTE — Telephone Encounter (Signed)
S/w pt to clarify procedure. Pt is having robotic proctectomy 12/30/17. Pt is aware to hold Xarelro 3 days prior and will resume ASAP per surgeon's discretion. Pt thanked [place] for our help.

## 2017-11-28 NOTE — Telephone Encounter (Signed)
Would clarify with office requesting clearance as they state patient does not need to hold any medications since he is not having a procedure, which does not match patient report.  He takes Xarelto for afib with CHADS2VASc score of 2 (age, HTN). If he is required to hold his Xarelto, 3 days beforehand is ok.

## 2017-12-21 NOTE — Patient Instructions (Signed)
Thomas Patterson  12/21/2017   Your procedure is scheduled on: 12-30-17   Report to [facility] Main  Entrance    Report to Admitting at 5:30 AM    Call this number if you have problems the morning of surgery [phone]   Remember: Do not eat food or drink liquids :After Midnight.     Take these medicines the morning of surgery with A SIP OF WATER: Atorvastatin (Lipitor), Metoprolol Succinate (Toprol-XL), Diltiazem (Matzem). You may also bring and use your eyedrops as needed.                                You may not have any metal on your body including hair pins and              piercings  Do not wear jewelry, lotions, powders or deodorant             Men may shave face and neck.   Do not bring valuables to the hospital. [place].  Contacts, dentures or bridgework may not be worn into surgery.  Leave suitcase in the car. After surgery it may be brought to your room.      Special Instructions: N/A              Please read over the following fact sheets you were given: _____________________________________________________________________             [facility] - Preparing for Surgery Before surgery, you can play an important role.  Because skin is not sterile, your skin needs to be as free of germs as possible.  You can reduce the number of germs on your skin by washing with CHG (chlorahexidine gluconate) soap before surgery.  CHG is an antiseptic cleaner which kills germs and bonds with the skin to continue killing germs even after washing. Please DO NOT use if you have an allergy to CHG or antibacterial soaps.  If your skin becomes reddened/irritated stop using the CHG and inform your nurse when you arrive at Short Stay. Do not shave (including legs and underarms) for at least 48 hours prior to the first CHG shower.  You may shave your face/neck. Please follow these instructions carefully:  1.  Shower  with CHG Soap the night before surgery and the  morning of Surgery.  2.  If you choose to wash your hair, wash your hair first as usual with your  normal  shampoo.  3.  After you shampoo, rinse your hair and body thoroughly to remove the  shampoo.                           4.  Use CHG as you would any other liquid soap.  You can apply chg directly  to the skin and wash                       Gently with a scrungie or clean washcloth.  5.  Apply the CHG Soap to your body ONLY FROM THE NECK DOWN.   Do not use on face/ open  Wound or open sores. Avoid contact with eyes, ears mouth and genitals (private parts).                       Wash face,  Genitals (private parts) with your normal soap.             6.  Wash thoroughly, paying special attention to the area where your surgery  will be performed.  7.  Thoroughly rinse your body with warm water from the neck down.  8.  DO NOT shower/wash with your normal soap after using and rinsing off  the CHG Soap.                9.  Pat yourself dry with a clean towel.            10.  Wear clean pajamas.            11.  Place clean sheets on your bed the night of your first shower and do not  sleep with pets. Day of Surgery : Do not apply any lotions/deodorants the morning of surgery.  Please wear clean clothes to the hospital/surgery center.  FAILURE TO FOLLOW THESE INSTRUCTIONS MAY RESULT IN THE CANCELLATION OF YOUR SURGERY PATIENT SIGNATURE_________________________________  NURSE SIGNATURE__________________________________  ________________________________________________________________________  WHAT IS A BLOOD TRANSFUSION? Blood Transfusion Information  A transfusion is the replacement of blood or some of its parts. Blood is made up of multiple cells which provide different functions.  Red blood cells carry oxygen and are used for blood loss replacement.  White blood cells fight against infection.  Platelets control  bleeding.  Plasma helps clot blood.  Other blood products are available for specialized needs, such as hemophilia or other clotting disorders. BEFORE THE TRANSFUSION  Who gives blood for transfusions?   Healthy volunteers who are fully evaluated to make sure their blood is safe. This is blood bank blood. Transfusion therapy is the safest it has ever been in the practice of medicine. Before blood is taken from a donor, a complete history is taken to make sure that person has no history of diseases nor engages in risky social behavior (examples are intravenous drug use or sexual activity with multiple partners). The donor's travel history is screened to minimize risk of transmitting infections, such as malaria. The donated blood is tested for signs of infectious diseases, such as HIV and hepatitis. The blood is then tested to be sure it is compatible with you in order to minimize the chance of a transfusion reaction. If you or a relative donates blood, this is often done in anticipation of surgery and is not appropriate for emergency situations. It takes many days to process the donated blood. RISKS AND COMPLICATIONS Although transfusion therapy is very safe and saves many lives, the main dangers of transfusion include:   Getting an infectious disease.  Developing a transfusion reaction. This is an allergic reaction to something in the blood you were given. Every precaution is taken to prevent this. The decision to have a blood transfusion has been considered carefully by your caregiver before blood is given. Blood is not given unless the benefits outweigh the risks. AFTER THE TRANSFUSION  Right after receiving a blood transfusion, you will usually feel much better and more energetic. This is especially true if your red blood cells have gotten low (anemic). The transfusion raises the level of the red blood cells which carry oxygen, and this usually causes an energy increase.  The  nurse  administering the transfusion will monitor you carefully for complications. HOME CARE INSTRUCTIONS  No special instructions are needed after a transfusion. You may find your energy is better. Speak with your caregiver about any limitations on activity for underlying diseases you may have. SEEK MEDICAL CARE IF:   Your condition is not improving after your transfusion.  You develop redness or irritation at the intravenous (IV) site. SEEK IMMEDIATE MEDICAL CARE IF:  Any of the following symptoms occur over the next 12 hours:  Shaking chills.  You have a temperature by mouth above 102 F (38.9 C), not controlled by medicine.  Chest, back, or muscle pain.  People around you feel you are not acting correctly or are confused.  Shortness of breath or difficulty breathing.  Dizziness and fainting.  You get a rash or develop hives.  You have a decrease in urine output.  Your urine turns a dark color or changes to pink, red, or brown. Any of the following symptoms occur over the next 10 days:  You have a temperature by mouth above 102 F (38.9 C), not controlled by medicine.  Shortness of breath.  Weakness after normal activity.  The white part of the eye turns yellow (jaundice).  You have a decrease in the amount of urine or are urinating less often.  Your urine turns a dark color or changes to pink, red, or brown. Document Released: 07/09/2000 Document Revised: 10/04/2011 Document Reviewed: 02/26/2008 [facility] Patient Information 2014 [place], [place].  _______________________________________________________________________

## 2017-12-21 NOTE — Progress Notes (Signed)
11-25-17 (Epic) Cardiac Clearance in Telephone Encounter per [place], [place]  10-06-17 (Epic) EKG

## 2017-12-23 ENCOUNTER — Encounter (HOSPITAL_COMMUNITY)
Admission: RE | Admit: 2017-12-23 | Discharge: 2017-12-23 | Disposition: A | Discharge status: Home / Self Care | Payer: Medicare Other | Source: Ambulatory Visit | Attending: Urology | Admitting: Urology

## 2017-12-23 ENCOUNTER — Encounter (HOSPITAL_COMMUNITY): Payer: Self-pay

## 2017-12-23 ENCOUNTER — Other Ambulatory Visit: Payer: Self-pay

## 2017-12-23 DIAGNOSIS — Z01818 Encounter for other preprocedural examination: Secondary | ICD-10-CM | POA: Diagnosis not present

## 2017-12-23 DIAGNOSIS — C61 Malignant neoplasm of prostate: Secondary | ICD-10-CM | POA: Diagnosis not present

## 2017-12-23 DIAGNOSIS — Z79899 Other long term (current) drug therapy: Secondary | ICD-10-CM | POA: Insufficient documentation

## 2017-12-23 DIAGNOSIS — Z7901 Long term (current) use of anticoagulants: Secondary | ICD-10-CM | POA: Insufficient documentation

## 2017-12-23 DIAGNOSIS — I10 Essential (primary) hypertension: Secondary | ICD-10-CM | POA: Insufficient documentation

## 2017-12-23 DIAGNOSIS — I481 Persistent atrial fibrillation: Secondary | ICD-10-CM | POA: Insufficient documentation

## 2017-12-23 LAB — COMPREHENSIVE METABOLIC PANEL
ALBUMIN: 4.3 g/dL (ref 3.5–5.0)
ALT: 35 U/L (ref 17–63)
ANION GAP: 9 (ref 5–15)
AST: 28 U/L (ref 15–41)
Alkaline Phosphatase: 117 U/L (ref 38–126)
BUN: 22 mg/dL — ABNORMAL HIGH (ref 6–20)
CHLORIDE: 106 mmol/L (ref 101–111)
CO2: 28 mmol/L (ref 22–32)
Calcium: 9.9 mg/dL (ref 8.9–10.3)
Creatinine, Ser: 0.88 mg/dL (ref 0.61–1.24)
GFR calc non Af Amer: >60 mL/min (ref >60)
GLUCOSE: 149 mg/dL — AB (ref 65–99)
Potassium: 3.8 mmol/L (ref 3.5–5.1)
SODIUM: 143 mmol/L (ref 135–145)
Total Bilirubin: 0.7 mg/dL (ref 0.3–1.2)
Total Protein: 7.9 g/dL (ref 6.5–8.1)

## 2017-12-23 LAB — CBC
HCT: 40.7 % (ref 39.0–52.0)
Hemoglobin: 14.2 g/dL (ref 13.0–17.0)
MCH: 32.6 pg (ref 26.0–34.0)
MCHC: 34.9 g/dL (ref 30.0–36.0)
MCV: 93.6 fL (ref 78.0–100.0)
PLATELETS: 169 10*3/uL (ref 150–400)
RBC: 4.35 MIL/uL (ref 4.22–5.81)
RDW: 12.7 % (ref 11.5–15.5)
WBC: 4.9 10*3/uL (ref 4.0–10.5)

## 2017-12-24 LAB — URINE CULTURE: Culture: NO GROWTH

## 2017-12-26 LAB — ABO/RH: ABO/RH(D): B NEG

## 2017-12-30 ENCOUNTER — Other Ambulatory Visit: Payer: Self-pay

## 2017-12-30 ENCOUNTER — Encounter (HOSPITAL_COMMUNITY)
Admission: RE | Disposition: A | Discharge status: Home / Self Care | Payer: Self-pay | Source: Ambulatory Visit | Attending: Urology

## 2017-12-30 ENCOUNTER — Observation Stay (HOSPITAL_COMMUNITY)
Admission: RE | Admit: 2017-12-30 | Discharge: 2017-12-31 | Disposition: A | Discharge status: Home / Self Care | Payer: Medicare Other | Source: Ambulatory Visit | Attending: Urology | Admitting: Urology

## 2017-12-30 ENCOUNTER — Ambulatory Visit (HOSPITAL_COMMUNITY): Payer: Medicare Other | Admitting: Certified Registered Nurse Anesthetist

## 2017-12-30 ENCOUNTER — Encounter (HOSPITAL_COMMUNITY): Payer: Self-pay

## 2017-12-30 DIAGNOSIS — Z7901 Long term (current) use of anticoagulants: Secondary | ICD-10-CM | POA: Diagnosis not present

## 2017-12-30 DIAGNOSIS — F17200 Nicotine dependence, unspecified, uncomplicated: Secondary | ICD-10-CM | POA: Insufficient documentation

## 2017-12-30 DIAGNOSIS — I739 Peripheral vascular disease, unspecified: Secondary | ICD-10-CM | POA: Diagnosis not present

## 2017-12-30 DIAGNOSIS — Z79899 Other long term (current) drug therapy: Secondary | ICD-10-CM | POA: Insufficient documentation

## 2017-12-30 DIAGNOSIS — I4891 Unspecified atrial fibrillation: Secondary | ICD-10-CM | POA: Diagnosis not present

## 2017-12-30 DIAGNOSIS — I10 Essential (primary) hypertension: Secondary | ICD-10-CM | POA: Insufficient documentation

## 2017-12-30 DIAGNOSIS — C61 Malignant neoplasm of prostate: Principal | ICD-10-CM | POA: Insufficient documentation

## 2017-12-30 HISTORY — PX: LYMPHADENECTOMY: SHX5960

## 2017-12-30 HISTORY — PX: ROBOT ASSISTED LAPAROSCOPIC RADICAL PROSTATECTOMY: SHX5141

## 2017-12-30 LAB — TYPE AND SCREEN
ABO/RH(D): B NEG
ANTIBODY SCREEN: NEGATIVE

## 2017-12-30 LAB — HEMOGLOBIN AND HEMATOCRIT, BLOOD
HEMATOCRIT: 37.3 % — AB (ref 39.0–52.0)
Hemoglobin: 12.9 g/dL — ABNORMAL LOW (ref 13.0–17.0)

## 2017-12-30 SURGERY — PROSTATECTOMY, RADICAL, ROBOT-ASSISTED, LAPAROSCOPIC
Anesthesia: General

## 2017-12-30 MED ORDER — KETOROLAC TROMETHAMINE 15 MG/ML IJ SOLN
INTRAMUSCULAR | Status: AC
  Filled 2017-12-30: qty 1

## 2017-12-30 MED ORDER — ACETAMINOPHEN 10 MG/ML IV SOLN
1000.0000 mg | Freq: Four times a day (QID) | INTRAVENOUS | Status: DC
  Administered 2017-12-30 – 2017-12-31 (×3): 1000 mg via INTRAVENOUS
  Filled 2017-12-30 (×3): qty 100

## 2017-12-30 MED ORDER — PROPOFOL 10 MG/ML IV BOLUS
INTRAVENOUS | Status: DC | PRN
  Administered 2017-12-30: 200 mg via INTRAVENOUS

## 2017-12-30 MED ORDER — MIDAZOLAM HCL 5 MG/5ML IJ SOLN
INTRAMUSCULAR | Status: DC | PRN
  Administered 2017-12-30: 2 mg via INTRAVENOUS

## 2017-12-30 MED ORDER — FENTANYL CITRATE (PF) 100 MCG/2ML IJ SOLN
INTRAMUSCULAR | Status: DC | PRN
  Administered 2017-12-30 (×3): 50 ug via INTRAVENOUS
  Administered 2017-12-30: 100 ug via INTRAVENOUS

## 2017-12-30 MED ORDER — MIDAZOLAM HCL 2 MG/2ML IJ SOLN
INTRAMUSCULAR | Status: AC
  Filled 2017-12-30: qty 2

## 2017-12-30 MED ORDER — MEPERIDINE HCL 50 MG/ML IJ SOLN: 6.2500 mg | INTRAMUSCULAR | Status: DC | PRN

## 2017-12-30 MED ORDER — SODIUM CHLORIDE 0.9 % IJ SOLN
INTRAMUSCULAR | Status: DC | PRN
  Administered 2017-12-30: 20 mL

## 2017-12-30 MED ORDER — HYDROMORPHONE HCL 1 MG/ML IJ SOLN
INTRAMUSCULAR | Status: AC
  Filled 2017-12-30: qty 1

## 2017-12-30 MED ORDER — ONDANSETRON HCL 4 MG/2ML IJ SOLN
INTRAMUSCULAR | Status: DC | PRN
  Administered 2017-12-30: 4 mg via INTRAVENOUS

## 2017-12-30 MED ORDER — LOSARTAN POTASSIUM 50 MG PO TABS
100.0000 mg | ORAL_TABLET | Freq: Every day | ORAL | Status: DC
  Administered 2017-12-31: 100 mg via ORAL
  Filled 2017-12-30: qty 2

## 2017-12-30 MED ORDER — DILTIAZEM HCL ER COATED BEADS 180 MG PO CP24
300.0000 mg | ORAL_CAPSULE | Freq: Every day | ORAL | Status: DC
  Administered 2017-12-31: 300 mg via ORAL
  Filled 2017-12-30: qty 1

## 2017-12-30 MED ORDER — CEFAZOLIN SODIUM-DEXTROSE 2-4 GM/100ML-% IV SOLN
2.0000 g | INTRAVENOUS | Status: AC
  Administered 2017-12-30: 2 g via INTRAVENOUS
  Filled 2017-12-30 (×2): qty 100

## 2017-12-30 MED ORDER — HYDROMORPHONE HCL 1 MG/ML IJ SOLN
0.2500 mg | INTRAMUSCULAR | Status: DC | PRN
  Administered 2017-12-30 (×2): 0.5 mg via INTRAVENOUS

## 2017-12-30 MED ORDER — DILTIAZEM HCL ER COATED BEADS 300 MG PO TB24: 300.0000 mg | ORAL_TABLET | Freq: Every day | ORAL | Status: DC

## 2017-12-30 MED ORDER — DEXAMETHASONE SODIUM PHOSPHATE 10 MG/ML IJ SOLN
INTRAMUSCULAR | Status: AC
  Filled 2017-12-30: qty 1

## 2017-12-30 MED ORDER — STERILE WATER FOR IRRIGATION IR SOLN
Status: DC | PRN
  Administered 2017-12-30: 1000 mL

## 2017-12-30 MED ORDER — SUGAMMADEX SODIUM 200 MG/2ML IV SOLN
INTRAVENOUS | Status: AC
  Filled 2017-12-30: qty 2

## 2017-12-30 MED ORDER — HYDROMORPHONE HCL 1 MG/ML IJ SOLN: 0.5000 mg | INTRAMUSCULAR | Status: DC | PRN

## 2017-12-30 MED ORDER — BUPIVACAINE-EPINEPHRINE (PF) 0.5% -1:200000 IJ SOLN
INTRAMUSCULAR | Status: AC
  Filled 2017-12-30: qty 30

## 2017-12-30 MED ORDER — SULFAMETHOXAZOLE-TRIMETHOPRIM 800-160 MG PO TABS: 1.0000 | ORAL_TABLET | Freq: Two times a day (BID) | ORAL | 0 refills | Status: DC

## 2017-12-30 MED ORDER — METOPROLOL SUCCINATE ER 25 MG PO TB24
25.0000 mg | ORAL_TABLET | Freq: Every day | ORAL | Status: DC
  Administered 2017-12-31: 25 mg via ORAL
  Filled 2017-12-30: qty 1

## 2017-12-30 MED ORDER — LACTATED RINGERS IV SOLN
INTRAVENOUS | Status: DC
  Administered 2017-12-30 (×3): via INTRAVENOUS

## 2017-12-30 MED ORDER — LOSARTAN POTASSIUM 50 MG PO TABS: 100.0000 mg | ORAL_TABLET | Freq: Every day | ORAL | Status: DC

## 2017-12-30 MED ORDER — ACETAMINOPHEN 325 MG PO TABS: 650.0000 mg | ORAL_TABLET | ORAL | Status: DC | PRN

## 2017-12-30 MED ORDER — SODIUM CHLORIDE 0.9 % IV BOLUS
1000.0000 mL | Freq: Once | INTRAVENOUS | Status: AC
  Administered 2017-12-30: 1000 mL via INTRAVENOUS

## 2017-12-30 MED ORDER — HYDROCHLOROTHIAZIDE 25 MG PO TABS: 25.0000 mg | ORAL_TABLET | Freq: Every day | ORAL | Status: DC

## 2017-12-30 MED ORDER — DEXTROSE-NACL 5-0.45 % IV SOLN
INTRAVENOUS | Status: DC
  Administered 2017-12-30 – 2017-12-31 (×2): via INTRAVENOUS

## 2017-12-30 MED ORDER — DEXAMETHASONE SODIUM PHOSPHATE 10 MG/ML IJ SOLN
INTRAMUSCULAR | Status: DC | PRN
  Administered 2017-12-30: 10 mg via INTRAVENOUS

## 2017-12-30 MED ORDER — DIPHENHYDRAMINE HCL 50 MG/ML IJ SOLN: 12.5000 mg | Freq: Four times a day (QID) | INTRAMUSCULAR | Status: DC | PRN

## 2017-12-30 MED ORDER — BUPIVACAINE LIPOSOME 1.3 % IJ SUSP
20.0000 mL | Freq: Once | INTRAMUSCULAR | Status: AC
  Administered 2017-12-30: 20 mL
  Filled 2017-12-30: qty 20

## 2017-12-30 MED ORDER — PROPOFOL 10 MG/ML IV BOLUS
INTRAVENOUS | Status: AC
  Filled 2017-12-30: qty 20

## 2017-12-30 MED ORDER — ROCURONIUM BROMIDE 10 MG/ML (PF) SYRINGE
PREFILLED_SYRINGE | INTRAVENOUS | Status: AC
Start: 2017-12-30 — End: ?
  Filled 2017-12-30: qty 5

## 2017-12-30 MED ORDER — LIDOCAINE 2% (20 MG/ML) 5 ML SYRINGE
INTRAMUSCULAR | Status: AC
  Filled 2017-12-30: qty 5

## 2017-12-30 MED ORDER — HYDROCODONE-ACETAMINOPHEN 5-325 MG PO TABS: 1.0000 | ORAL_TABLET | Freq: Four times a day (QID) | ORAL | 0 refills | Status: DC | PRN

## 2017-12-30 MED ORDER — BUPIVACAINE-EPINEPHRINE 0.5% -1:200000 IJ SOLN
INTRAMUSCULAR | Status: DC | PRN
  Administered 2017-12-30: 20 mL
  Administered 2017-12-30: 14 mL

## 2017-12-30 MED ORDER — ATORVASTATIN CALCIUM 40 MG PO TABS: 40.0000 mg | ORAL_TABLET | Freq: Every day | ORAL | Status: DC

## 2017-12-30 MED ORDER — ROCURONIUM BROMIDE 10 MG/ML (PF) SYRINGE
PREFILLED_SYRINGE | INTRAVENOUS | Status: AC
  Filled 2017-12-30: qty 5

## 2017-12-30 MED ORDER — ROCURONIUM BROMIDE 50 MG/5ML IV SOSY
PREFILLED_SYRINGE | INTRAVENOUS | Status: DC | PRN
  Administered 2017-12-30 (×4): 20 mg via INTRAVENOUS
  Administered 2017-12-30: 60 mg via INTRAVENOUS

## 2017-12-30 MED ORDER — ONDANSETRON HCL 4 MG/2ML IJ SOLN
INTRAMUSCULAR | Status: AC
  Filled 2017-12-30: qty 2

## 2017-12-30 MED ORDER — KETOROLAC TROMETHAMINE 15 MG/ML IJ SOLN
15.0000 mg | Freq: Four times a day (QID) | INTRAMUSCULAR | Status: DC
  Administered 2017-12-30 – 2017-12-31 (×4): 15 mg via INTRAVENOUS
  Filled 2017-12-30 (×4): qty 1

## 2017-12-30 MED ORDER — DIPHENHYDRAMINE HCL 12.5 MG/5ML PO ELIX: 12.5000 mg | ORAL_SOLUTION | Freq: Four times a day (QID) | ORAL | Status: DC | PRN

## 2017-12-30 MED ORDER — ONDANSETRON HCL 4 MG/2ML IJ SOLN: 4.0000 mg | INTRAMUSCULAR | Status: DC | PRN

## 2017-12-30 MED ORDER — FENTANYL CITRATE (PF) 250 MCG/5ML IJ SOLN
INTRAMUSCULAR | Status: AC
  Filled 2017-12-30: qty 5

## 2017-12-30 MED ORDER — SUGAMMADEX SODIUM 200 MG/2ML IV SOLN
INTRAVENOUS | Status: DC | PRN
  Administered 2017-12-30: 200 mg via INTRAVENOUS

## 2017-12-30 MED ORDER — PROMETHAZINE HCL 25 MG/ML IJ SOLN: 6.2500 mg | INTRAMUSCULAR | Status: DC | PRN

## 2017-12-30 MED ORDER — LIDOCAINE 2% (20 MG/ML) 5 ML SYRINGE
INTRAMUSCULAR | Status: DC | PRN
  Administered 2017-12-30: 100 mg via INTRAVENOUS

## 2017-12-30 MED ORDER — TRAMADOL HCL 50 MG PO TABS: 50.0000 mg | ORAL_TABLET | Freq: Four times a day (QID) | ORAL | Status: DC | PRN

## 2017-12-30 MED ORDER — LACTATED RINGERS IR SOLN
Status: DC | PRN
  Administered 2017-12-30: 1000 mL

## 2017-12-30 MED ORDER — SODIUM CHLORIDE 0.9 % IJ SOLN
INTRAMUSCULAR | Status: AC
Start: 2017-12-30 — End: ?
  Filled 2017-12-30: qty 20

## 2017-12-30 MED ORDER — HYDROCHLOROTHIAZIDE 25 MG PO TABS
25.0000 mg | ORAL_TABLET | Freq: Every day | ORAL | Status: DC
  Administered 2017-12-31: 25 mg via ORAL
  Filled 2017-12-30: qty 1

## 2017-12-30 MED ORDER — LACTATED RINGERS IV SOLN: INTRAVENOUS | Status: DC

## 2017-12-30 SURGICAL SUPPLY — 52 items
CATH FOLEY 2WAY SLVR 18FR 30CC (CATHETERS) ×4 IMPLANT
CATH TIEMANN FOLEY 18FR 5CC (CATHETERS) ×4 IMPLANT
CHLORAPREP W/TINT 26ML (MISCELLANEOUS) ×4 IMPLANT
CLIP VESOLOCK LG 6/CT PURPLE (CLIP) ×12 IMPLANT
COVER SURGICAL LIGHT HANDLE (MISCELLANEOUS) ×4 IMPLANT
COVER TIP SHEARS 8 DVNC (MISCELLANEOUS) ×2 IMPLANT
COVER TIP SHEARS 8MM DA VINCI (MISCELLANEOUS) ×2
CUTTER ECHEON FLEX ENDO 45 340 (ENDOMECHANICALS) ×4 IMPLANT
DECANTER SPIKE VIAL GLASS SM (MISCELLANEOUS) ×4 IMPLANT
DERMABOND ADVANCED (GAUZE/BANDAGES/DRESSINGS) ×4
DERMABOND ADVANCED .7 DNX12 (GAUZE/BANDAGES/DRESSINGS) ×4 IMPLANT
DRAPE ARM DVNC X/XI (DISPOSABLE) ×8 IMPLANT
DRAPE COLUMN DVNC XI (DISPOSABLE) ×2 IMPLANT
DRAPE DA VINCI XI ARM (DISPOSABLE) ×8
DRAPE DA VINCI XI COLUMN (DISPOSABLE) ×2
DRAPE SURG IRRIG POUCH 19X23 (DRAPES) ×4 IMPLANT
DRSG TEGADERM 4X4.75 (GAUZE/BANDAGES/DRESSINGS) ×4 IMPLANT
ELECT REM PT RETURN 15FT ADLT (MISCELLANEOUS) ×4 IMPLANT
GAUZE SPONGE 2X2 8PLY STRL LF (GAUZE/BANDAGES/DRESSINGS) IMPLANT
GAUZE VASELINE 1X8 (GAUZE/BANDAGES/DRESSINGS) ×4 IMPLANT
GLOVE BIO SURGEON STRL SZ 6.5 (GLOVE) ×3 IMPLANT
GLOVE BIO SURGEONS STRL SZ 6.5 (GLOVE) ×1
GLOVE BIOGEL M STRL SZ7.5 (GLOVE) ×8 IMPLANT
GOWN STRL REUS W/TWL LRG LVL3 (GOWN DISPOSABLE) ×4 IMPLANT
GOWN STRL REUS W/TWL XL LVL3 (GOWN DISPOSABLE) ×8 IMPLANT
HEMOSTAT SURGICEL 4X8 (HEMOSTASIS) ×4 IMPLANT
HOLDER FOLEY CATH W/STRAP (MISCELLANEOUS) ×4 IMPLANT
IRRIG SUCT STRYKERFLOW 2 WTIP (MISCELLANEOUS) ×4
IRRIGATION SUCT STRKRFLW 2 WTP (MISCELLANEOUS) ×2 IMPLANT
PACK ROBOT UROLOGY CUSTOM (CUSTOM PROCEDURE TRAY) ×4 IMPLANT
PAD POSITIONING PINK XL (MISCELLANEOUS) ×4 IMPLANT
PORT ACCESS TROCAR AIRSEAL 12 (TROCAR) ×2 IMPLANT
PORT ACCESS TROCAR AIRSEAL 5M (TROCAR) ×2
SEAL CANN UNIV 5-8 DVNC XI (MISCELLANEOUS) ×8 IMPLANT
SEAL XI 5MM-8MM UNIVERSAL (MISCELLANEOUS) ×8
SET TRI-LUMEN FLTR TB AIRSEAL (TUBING) ×4 IMPLANT
SOLUTION ELECTROLUBE (MISCELLANEOUS) ×4 IMPLANT
SPONGE GAUZE 2X2 STER 10/PKG (GAUZE/BANDAGES/DRESSINGS)
STAPLE RELOAD 45 GRN (STAPLE) ×2 IMPLANT
STAPLE RELOAD 45MM GREEN (STAPLE) ×2
SUT ETHILON 3 0 PS 1 (SUTURE) ×4 IMPLANT
SUT MNCRL AB 4-0 PS2 18 (SUTURE) ×8 IMPLANT
SUT V-LOC BARB 180 2/0GR6 GS22 (SUTURE) ×4
SUT VIC AB 0 CT1 27 (SUTURE) ×2
SUT VIC AB 0 CT1 27XBRD ANTBC (SUTURE) ×2 IMPLANT
SUT VIC AB 2-0 SH 27 (SUTURE) ×2
SUT VIC AB 2-0 SH 27X BRD (SUTURE) ×2 IMPLANT
SUT VICRYL 0 UR6 27IN ABS (SUTURE) ×8 IMPLANT
SUT VLOC BARB 180 ABS3/0GR12 (SUTURE) ×8
SUTURE V-LC BRB 180 2/0GR6GS22 (SUTURE) ×2 IMPLANT
SUTURE VLOC BRB 180 ABS3/0GR12 (SUTURE) ×4 IMPLANT
TOWEL OR NON WOVEN STRL DISP B (DISPOSABLE) ×4 IMPLANT

## 2017-12-30 NOTE — Discharge Instructions (Signed)
1. Activity:  You are encouraged to ambulate frequently (about every hour during waking hours) to help prevent blood clots from forming in your legs or lungs.  However, you should not engage in any heavy lifting (> 10-15 lbs), strenuous activity, or straining. 2. Diet: You should continue a clear liquid diet until passing gas from below.  Once this occurs, you may advance your diet to a soft diet that would be easy to digest (i.e soups, scrambled eggs, mashed potatoes, etc.) for 24 hours just as you would if getting over a bad stomach flu.  If tolerating this diet well for 24 hours, you may then begin eating regular food.  It will be normal to have some amount of bloating, nausea, and abdominal discomfort intermittently. 3. Prescriptions:  You will be provided a prescription for pain medication to take as needed.  If your pain is not severe enough to require the prescription pain medication, you may take Tylenol instead.  You should also take an over the counter stool softener (Colace 100 mg twice daily) to avoid straining with bowel movements as the pain medication may constipate you. Finally, you will also be provided a prescription for an antibiotic to begin the day prior to your return visit in the office for catheter removal. 4. Catheter care: You will be taught how to take care of the catheter by the nursing staff prior to discharge from the hospital.  You may use both a leg bag and the larger bedside bag but it is recommended to at least use the bigger bedside bag at nighttime as the leg bag is small and will fill up overnight and also does not drain as well when lying flat. You may periodically feel a strong urge to void with the catheter in place.  This is a bladder spasm and most often can occur when having a bowel movement or when you are moving around. It is typically self-limited and usually will stop after a few minutes.  You may use some Vaseline or Neosporin around the tip of the catheter to  reduce friction at the tip of the penis. 5. Incisions: You may remove your dressing bandages the 2nd day after surgery.  You most likely will have a few small staples in each of the incisions and once the bandages are removed, the incisions may stay open to air.  You may start showering (not soaking or bathing in water) 48 hours after surgery and the incisions simply need to be patted dry after the shower.  No additional care is needed. 6. What to call us about: You should call the office [phone]) if you develop fever > 101, persistent vomiting, or the catheter stops draining. Also, feel free to call with any other questions you may have and remember the handout that was provided to you as a reference preoperatively which answers many of the common questions that arise after surgery. 7. You may resume advil, aleve, vitamins, and supplements 7 days after surgery.  You may resume aspirin in 48 hours after surgery and Xarelto 7 days after surgery.

## 2017-12-30 NOTE — Transfer of Care (Signed)
Immediate Anesthesia Transfer of Care Note  Patient: Thomas Patterson  Procedure(s) Performed: XI ROBOTIC ASSISTED LAPAROSCOPIC RADICAL PROSTATECTOMY (N/A ) LYMPHADENECTOMY (Bilateral )  Patient Location: PACU  Anesthesia Type:General  Level of Consciousness: awake, alert  and oriented  Airway & Oxygen Therapy: Patient Spontanous Breathing and Patient connected to face mask oxygen  Post-op Assessment: Report given to RN and Post -op Vital signs reviewed and stable  Post vital signs: Reviewed and stable  Last Vitals:  Vitals Value Taken Time  BP 114/68 12/30/2017 11:45 AM  Temp    Pulse 57 12/30/2017 11:46 AM  Resp 9 12/30/2017 11:46 AM  SpO2 100 % 12/30/2017 11:46 AM  Vitals shown include unvalidated device data.  Last Pain:  Vitals:   12/30/17 0551  TempSrc:   PainSc: 0-No pain         Complications: No apparent anesthesia complications

## 2017-12-30 NOTE — Anesthesia Preprocedure Evaluation (Addendum)
Anesthesia Evaluation  Patient identified by MRN, date of birth, ID band Patient awake    Reviewed: Allergy & Precautions, NPO status , Patient's Chart, lab work & pertinent test results  Airway Mallampati: II  TM Distance: <3 FB Neck ROM: Full    Dental  (+) Teeth Intact, Dental Advisory Given   Pulmonary former smoker,    breath sounds clear to auscultation       Cardiovascular hypertension, Pt. on medications and Pt. on home beta blockers + Peripheral Vascular Disease  + dysrhythmias Atrial Fibrillation  Rhythm:Irregular Rate:Normal     Neuro/Psych PSYCHIATRIC DISORDERS    GI/Hepatic negative GI ROS, Neg liver ROS,   Endo/Other  negative endocrine ROS  Renal/GU negative Renal ROS     Musculoskeletal negative musculoskeletal ROS (+)   Abdominal Normal abdominal exam  (+)   Peds  Hematology negative hematology ROS (+)   Anesthesia Other Findings   Reproductive/Obstetrics                            Lab Results  Component Value Date   WBC 4.9 12/23/2017   HGB 14.2 12/23/2017   HCT 40.7 12/23/2017   MCV 93.6 12/23/2017   PLT 169 12/23/2017   Lab Results  Component Value Date   CREATININE 0.88 12/23/2017   BUN 22 (H) 12/23/2017   NA 143 12/23/2017   K 3.8 12/23/2017   CL 106 12/23/2017   CO2 28 12/23/2017   No results found for: INR, PROTIME  EKG: atrial fibrillation.  Echo: EF 55%  Anesthesia Physical Anesthesia Plan  ASA: III  Anesthesia Plan: General   Post-op Pain Management:    Induction: Intravenous  PONV Risk Score and Plan: 3 and Ondansetron, Dexamethasone and Midazolam  Airway Management Planned: Oral ETT  Additional Equipment: None  Intra-op Plan:   Post-operative Plan: Extubation in OR  Informed Consent: I have reviewed the patients History and Physical, chart, labs and discussed the procedure including the risks, benefits and alternatives for the  proposed anesthesia with the patient or authorized representative who has indicated his/her understanding and acceptance.   Dental advisory given  Plan Discussed with: CRNA  Anesthesia Plan Comments:        Anesthesia Quick Evaluation

## 2017-12-30 NOTE — H&P (Signed)
f/u to monitor Prostate Cancer  HPI: Thomas Patterson is a 67 year-old male established patient who is here for interval evaluation of his prostate cancer.  He was diagnosed with prostate cancer in approximately 10/14/2017. The patient's gleason score was: 3+4=7. Pretreatment PSA: 4.58.   This was drawn on approximately 09/23/2017.   Pre-treatment SHIM: 10. He does have problems with erections. His erections are not satisfactory for intercouse.   The patient denies having urinary incontinence.   IPSS: 5, 1  Prostate cancer profile  Stage: T1c  PSA: 4.58  Biopsy , 5 /12 cores positive: All 4 cores of the base both right and left were positive  Prostate volume: 49 g   Prostate cancer nomogram after radical prostatectomy:  OC- 45%  ECE- 54%  SVI-  LNI -3%  PFS (surgery)-87% in 5 years, 78% at 10 years   The patient presents today for discussion after undergoing a prostate biopsy. The bleeding is now stopped and he has resumed his erectile toe.   The patient takes her also for atrial fibrillation. He also has a history of hypertension.   The patient has no significant past surgical history. He is otherwise quite healthy, can easily navigate a flight of steps or walk a block.     ALLERGIES: None   MEDICATIONS: Hydrochlorothiazide 25 mg tablet  Metoprolol Succinate 25 mg tablet, extended release 24 hr  Sildenafil 20 mg tablet 2-5 tablets daily as needed  Atorvastatin Calcium 40 mg tablet  Losartan Potassium 100 mg tablet  Matzim La 300 mg tablet, extended release 24 hr  Xarelto 20 mg tablet     GU PSH: Prostate Needle Biopsy - 10/17/2017    NON-GU PSH: Cataract surgery Surgical Pathology, Gross And Microscopic Examination For Prostate Needle - 10/17/2017    GU PMH: Elevated PSA - 09/20/2017, - 06/14/2017    NON-GU PMH: Atrial Fibrillation Hypertension    FAMILY HISTORY: No Family History    SOCIAL HISTORY: Marital Status: Married Preferred Language: English; Race:  White Current Smoking Status: Patient smokes. Has smoked since 05/27/1975. Smokes 1 pack per day.   Tobacco Use Assessment Completed: Used Tobacco in last 30 days? Drinks 3 drinks per day. Types of alcohol consumed: Beer.  Drinks 3 caffeinated drinks per day.    REVIEW OF SYSTEMS:    GU Review Male:   Patient denies frequent urination, hard to postpone urination, burning/ pain with urination, get up at night to urinate, leakage of urine, stream starts and stops, trouble starting your stream, have to strain to urinate , erection problems, and penile pain.  Gastrointestinal (Upper):   Patient denies nausea, vomiting, and indigestion/ heartburn.  Gastrointestinal (Lower):   Patient denies diarrhea and constipation.  Constitutional:   Patient denies fever, night sweats, weight loss, and fatigue.  Skin:   Patient denies skin rash/ lesion and itching.  Eyes:   Patient denies blurred vision and double vision.  Ears/ Nose/ Throat:   Patient denies sore throat and sinus problems.  Hematologic/Lymphatic:   Patient denies easy bruising and swollen glands.  Cardiovascular:   Patient denies leg swelling and chest pains.  Respiratory:   Patient denies cough and shortness of breath.  Endocrine:   Patient denies excessive thirst.  Musculoskeletal:   Patient denies back pain and joint pain.  Neurological:   Patient denies headaches and dizziness.  Psychologic:   Patient reports anxiety. Patient denies depression.   VITAL SIGNS:      10/24/2017 03:28 PM  BP 153/88 mmHg  Heart Rate 88 /min  Temperature 98.2 F / 36.7 C   PAST DATA REVIEWED:  Source Of History:  Patient  Lab Test Review:   PSA  Records Review:   Pathology Reports, Previous Doctor Records, Previous Patient Records   09/13/17 06/14/17  PSA  Total PSA 4.56 ng/mL 5.76 ng/mL  Free PSA 0.64 ng/mL 0.98 ng/mL  % Free PSA 14 % PSA 17 % PSA    PROCEDURES: None   ASSESSMENT:      ICD-10 Details  1 GU:   ED due to arterial insufficiency  - N52.01   2   Prostate Cancer - C61      PLAN:           Document Letter(s):  Created for Patient: Clinical Summary    The patient was counseled about the natural history of prostate cancer and the standard treatment options that are available for prostate cancer. It was explained to him how his age and life expectancy, clinical stage, Gleason score, and PSA affect his prognosis, the decision to proceed with additional staging studies, as well as how that information influences recommended treatment strategies. We discussed the roles for active surveillance, radiation therapy, surgical therapy, androgen deprivation, as well as ablative therapy options for the treatment of prostate cancer as appropriate to his individual cancer situation. We discussed the risks and benefits of these options with regard to their impact on cancer control and also in terms of potential adverse events, complications, and impact on quality of life particularly related to urinary, bowel, and sexual function. The patient was encouraged to ask questions throughout the discussion today and all questions were answered to his stated satisfaction. In addition, the patient was provided with and/or directed to appropriate resources and literature for further education about prostate cancer and treatment options.         Notes:   Given the patient is otherwise healthy except for atrial fibrillation and still young, I recommended that he strongly consider surgery. We also discussed additional treatment options including brachytherapy and radiation therapy. I've given the patient and his wife additional resources and more information. I recommended that they consider consultation with radiation therapy if so desired. I also offered a second opinion. The patient will think about his options and get back to [place] as to how he would like to proceed

## 2017-12-30 NOTE — Anesthesia Postprocedure Evaluation (Signed)
Anesthesia Post Note  Patient: Thomas Patterson  Procedure(s) Performed: XI ROBOTIC ASSISTED LAPAROSCOPIC RADICAL PROSTATECTOMY (N/A ) LYMPHADENECTOMY (Bilateral )     Patient location during evaluation: PACU Anesthesia Type: General Level of consciousness: awake and alert Pain management: pain level controlled Vital Signs Assessment: post-procedure vital signs reviewed and stable Respiratory status: spontaneous breathing, nonlabored ventilation, respiratory function stable and patient connected to nasal cannula oxygen Cardiovascular status: blood pressure returned to baseline and stable Postop Assessment: no apparent nausea or vomiting Anesthetic complications: no    Last Vitals:  Vitals:   12/30/17 1500 12/30/17 1525  BP: 111/75 133/82  Pulse: 66 67  Resp: 12 16  Temp: 36.9 C 36.7 C  SpO2: 100% 97%    Last Pain:  Vitals:   12/30/17 1539  TempSrc:   PainSc: 3                  Effie Berkshire

## 2017-12-30 NOTE — Anesthesia Procedure Notes (Addendum)
Procedure Name: Intubation Date/Time: 12/30/2017 7:43 AM Performed by: Montel Clock, CRNA Pre-anesthesia Checklist: Patient identified, Emergency Drugs available, Suction available, Patient being monitored and Timeout performed Patient Re-evaluated:Patient Re-evaluated prior to induction Oxygen Delivery Method: Circle system utilized Preoxygenation: Pre-oxygenation with 100% oxygen Induction Type: IV induction Ventilation: Mask ventilation without difficulty, Two handed mask ventilation required and Oral airway inserted - appropriate to patient size Laryngoscope Size: Glidescope and 4 Grade View: Grade II Tube type: Parker flex tip Tube size: 7.5 mm Number of attempts: 3 Airway Equipment and Method: Rigid stylet and Video-laryngoscopy Placement Confirmation: ETT inserted through vocal cords under direct vision,  positive ETCO2 and breath sounds checked- equal and bilateral Secured at: 24 cm Tube secured with: Tape Dental Injury: Teeth and Oropharynx as per pre-operative assessment  Difficulty Due To: Difficult Airway- due to anterior larynx Comments: Required 2-handed mask with 10 OA, easily ventilated. CRNA attempt MAC 3 grade 3 view, Dr. Smith Robert attempt MAC 4 grade 3 view, Glidescope 4 grade 1-2 view with ETT easily passed through cords.

## 2017-12-30 NOTE — Op Note (Signed)
Preoperative diagnosis:  1. Prostate Cancer   Postoperative diagnosis:  1. same   Procedure: 1. Robotic assisted laparoscopic radical prostatectomy 2. Bilateral pelvic lymph node dissection  Surgeon: Ardis Hughs, MD First Assistant: Debbrah Alar, PA Resident Surgeon: Dr. Maud Deed, MD  Anesthesia: General  Complications: None  Intraoperative findings: right nerve spare, tight vesico-urethral anastomosis.  EBL: 300  Specimens:  #1.  Prostate and seminal vesicals #2.  Bilateral pelvic lymph nodes  Indication: Thomas Patterson is a 67 y.o. patient with prostate cancer.  After reviewing the management options for treatment, he elected to proceed with the removal of his prostate. We have discussed the potential benefits and risks of the procedure, side effects of the proposed treatment, the likelihood of the patient achieving the goals of the procedure, and any potential problems that might occur during the procedure or recuperation. Informed consent has been obtained.  Description of procedure:  An assistant was required for this surgical procedure.  The duties of the assistant included but were not limited to suctioning, passing suture, camera manipulation, retraction. This procedure would not be able to be performed without an Environmental consultant.   The patient was consented in the preoperative holding area. He was in brought back to the operating room placed the table in supine position. General anesthesia was then induced and endotracheal tube was inserted. He was then placed in dorsolithotomy position and placed in steep Trendelenburg. He was then prepped and draped in the routine sterile fashion. We, the first assistant and I, then began by making a 10 mm incision supraumbilical midline incision the skin down through into the peritoneum. Then placed a 8 mm trocar. I then inflated the abdomen and inserted the 0 robotic lens. We then placed 2 additional a 8 millimeter trochars in the  patient's left lower abdomen proximally 9 cm apart and 2 trochars on the patient's right lower abdomen, one was an 8 mm trocar and the one most lateral was a 12 mm trocar which was used as the assistant port. A 5 mm trocar was placed by triangulating the 2 right lateral ports as a second assistant port. These ports were all placed under visual guidance. Once the ports were noted to be satisfactory position the robot was docked. We started with the 0 lens, monopolar scissors in the right hand and the [place] forceps the left hand as well as a fenestrated grasper as the third arm on the left-hand side.   We, the first assistant and I,  began our dissection the posterior plane incising the peritoneum at the level of the vas deferens. Isolated the left vas deferens and dissected it proximally towards the spermatic cord for 5 cm prior to ligating it. Then used this as traction to isolate the left the seminal vesicle which was then undressed bluntly and completely dissected out, all vessels were cauterized with a combination of bipolar and the monopolar scissors. We then turned our attention to the right side and similarly dissected out the right vas deferens and seminal vesicle. Once the SVs had been freed, we turned our attention to the posterior plane and bluntly dissected the tissue between the rectum and the posterior wall of the prostate bluntly out towards the apex.    At this point the bladder was taken down starting at the urachal remnant with a combination of both blunt dissection and sharp dissection using monopolar cautery the bladder was dropped down in the usual fashion to the medial umbilical ligaments laterally and the dorsal  vein of the prostate anteriorly creating our space of Retzius. We then turned our attention to the endopelvic fascia which was incised laterally starting on the patient's right-hand side the levator muscles were pushed off the prostate laterally up towards the dorsal vein  complex on the right-hand side. This process was then repeated on the left-hand side and a nice notch was created for the dorsal vein. I then used a 53mm stapler to staple the dorsal vein.   We,the first assistant and I, then located the bladder neck at the vesicoprostatic junction and using the monopolar scissors dissected down through the perivesical tissues and the bladder neck down to the prostatic urethra. The catheter was then deflated and pulled through our urethral opening and then used to retract the prostate anteriorly for the posterior bladder neck dissection. Once through the bladder neck and into the posterior plane of the prostate, the SVs were brought through the opening. The left pedicle was then isolated and systematically ligated with Weck clips and scissors. The nerve bundle was then peeled off the posterior lateral aspect of the prostate and bluntly dissected away off the prostate. This was then repeated on the right side.    I then came down through the dorsal venous complex anteriorly down to the membranous urethra using the monopolar. Once down to the urethra, it was transected sharply and the apex of the prostate was then dissected off the levator and rectourethralis muscles. Once the apex of the prostate had been dissected free we came back to the base of the prostate and bluntly push the rectum and nerve vascular bundle off the prostate the patient's left and used clips on the patient's right to free the prostate. Once the prostate was free it was placed off to the side. The pelvis was then irrigated with normal saline and noted to be relatively hemostatic.  Attention was then turned to the right pelvic sidewall. The fibrofatty tissue between the external iliac vein, confluence of the iliac vessels, hypogastric artery, and Cooper's ligament was dissected free from the pelvic sidewall with care to preserve the obturator nerve. Weck clips were used for lymphostasis and hemostasis. An  identical procedure was performed on the contralateral side and the lymphatic packets were removed for permanent pathologic analysis.  The prostate and both lymph node tissues were placed in the Endo Catch bag and the string brought to the 5 mm port.    The vesicourethral anastomosis was then completed with 2 interlocking 3-0 V. lock sutures running the anastomosis in the 6:00 position to the 12:00 position on each side and then tying it off on the top. The final catheter was then passed through the patient's urethra and into the bladder and 120 cc was instilled into the bladder to test the anastomosis. As there was no leak a 54 [place] Blake drain was passed through the left lateral port and placed around the vesicourethral anastomosis. A 12 mm assistant port on the right lateral side was then closed with 0 Vicryl with the help of the [employer] needle. The 12 mm midline infraumbilical incision was then extended another centimeter taken down and the fascia opened to remove the Endo Catch bag with the prostate specimen. The fascia was then closed with a 0 Vicryl and all skin ports were closed with 4-0 Monocryl in a subcutaneous fashion. Dermabond glue was then applied to the incisions. The drain was then secured to the skin with a 0 nylon stitch and dressing applied.   At  the end of the case all laps needles and sponges had been accounted for. There no immediate complications. The patient returned to the PACU in stable condition.

## 2017-12-31 ENCOUNTER — Encounter (HOSPITAL_COMMUNITY): Payer: Self-pay | Admitting: Urology

## 2017-12-31 DIAGNOSIS — C61 Malignant neoplasm of prostate: Secondary | ICD-10-CM | POA: Diagnosis not present

## 2017-12-31 LAB — BASIC METABOLIC PANEL
ANION GAP: 8 (ref 5–15)
BUN: 17 mg/dL (ref 6–20)
CALCIUM: 8.6 mg/dL — AB (ref 8.9–10.3)
CO2: 26 mmol/L (ref 22–32)
CREATININE: 1 mg/dL (ref 0.61–1.24)
Chloride: 102 mmol/L (ref 101–111)
GFR calc Af Amer: >60 mL/min (ref >60)
GLUCOSE: 187 mg/dL — AB (ref 65–99)
Potassium: 4.1 mmol/L (ref 3.5–5.1)
Sodium: 136 mmol/L (ref 135–145)

## 2017-12-31 LAB — HEMOGLOBIN AND HEMATOCRIT, BLOOD
HCT: 33.6 % — ABNORMAL LOW (ref 39.0–52.0)
Hemoglobin: 11.4 g/dL — ABNORMAL LOW (ref 13.0–17.0)

## 2017-12-31 NOTE — Progress Notes (Signed)
Pt discharged from the unit. Discharge instructions were reviewed with the pt. Jp drain d/c'd and educated on site care. Educated on foley care, no questions at this time.

## 2017-12-31 NOTE — Plan of Care (Signed)
Progressing

## 2017-12-31 NOTE — Discharge Summary (Signed)
Date of admission: 12/30/2017  Date of discharge: 12/31/2017  Admission diagnosis: Prostate cancer  Discharge diagnosis: Same   History and Physical: For full details, please see admission history and physical. Briefly, Thomas Patterson is a 67 y.o. year old patient with prostate cancer status post robotic assisted laparoscopic prostatectomy on 12/30/2017 with Dr. Louis Meckel.   Hospital Course: Routine postop course following robotic prostatectomy  Physical Exam:  General: Alert and oriented CV: RRR, palpable distal pulses Lungs: CTAB, equal chest rise Abdomen: Soft, NTND, no rebound or guarding Incisions: Clean, dry and intact Ext: NT, No erythema GU: Foley catheter in place and draining clear-yellow urine JP drain with serosanguineous output  Laboratory values:  Recent Labs    12/30/17 1212 12/31/17 0527  HGB 12.9* 11.4*  HCT 37.3* 33.6*   Recent Labs    12/31/17 0527  CREATININE 1.00    Disposition: Home  Discharge instruction: The patient was instructed to be ambulatory but told to refrain from heavy lifting, strenuous activity, or driving.  Discharge medications:  Allergies as of 12/31/2017   No Known Allergies     Medication List    STOP taking these medications   rivaroxaban 20 MG Tabs tablet Commonly known as:  XARELTO     TAKE these medications   atorvastatin 40 MG tablet Commonly known as:  LIPITOR TAKE 1 TABLET (40 MG TOTAL) BY MOUTH DAILY.   hydrochlorothiazide 25 MG tablet Commonly known as:  HYDRODIURIL Take 25 mg by mouth daily.   HYDROcodone-acetaminophen 5-325 MG tablet Commonly known as:  NORCO Take 1-2 tablets by mouth every 6 (six) hours as needed for moderate pain or severe pain.   losartan 100 MG tablet Commonly known as:  COZAAR Take 100 mg by mouth daily.   MATZIM LA 300 MG 24 hr tablet Generic drug:  diltiazem TAKE 1 TABLET (300 MG TOTAL) BY MOUTH DAILY.   metoprolol succinate 50 MG 24 hr tablet Commonly known as:  TOPROL-XL TAKE 1/2  TABLET BY MOUTH EVERY DAY   sulfamethoxazole-trimethoprim 800-160 MG tablet Commonly known as:  BACTRIM DS,SEPTRA DS Take 1 tablet by mouth 2 (two) times daily. Start the day prior to foley removal appointment   tetrahydrozoline 0.05 % ophthalmic solution Place 1 drop into both eyes 3 (three) times daily as needed (for dry/irritated eyes.).       Followup:  Follow-up Information    Ardis Hughs, MD On 01/05/2018.   Specialty:  Urology Why:  at 2:00 Contact information: [place] [address] [phone]

## 2018-01-19 ENCOUNTER — Encounter: Payer: Self-pay | Admitting: Medical Oncology

## 2018-01-19 ENCOUNTER — Other Ambulatory Visit: Payer: Self-pay | Admitting: Internal Medicine

## 2018-05-02 ENCOUNTER — Other Ambulatory Visit: Payer: Self-pay | Admitting: Internal Medicine

## 2018-06-20 ENCOUNTER — Other Ambulatory Visit: Payer: Self-pay | Admitting: Family Medicine

## 2018-06-20 DIAGNOSIS — Z87891 Personal history of nicotine dependence: Secondary | ICD-10-CM

## 2018-06-27 ENCOUNTER — Ambulatory Visit: Payer: Medicare Other

## 2018-06-28 ENCOUNTER — Ambulatory Visit
Admission: RE | Admit: 2018-06-28 | Discharge: 2018-06-28 | Disposition: A | Discharge status: Home / Self Care | Payer: Medicare Other | Source: Ambulatory Visit | Attending: Family Medicine | Admitting: Family Medicine

## 2018-06-28 DIAGNOSIS — Z87891 Personal history of nicotine dependence: Secondary | ICD-10-CM

## 2018-10-09 ENCOUNTER — Ambulatory Visit: Payer: Medicare Other | Admitting: Internal Medicine

## 2018-10-09 ENCOUNTER — Encounter: Payer: Self-pay | Admitting: Internal Medicine

## 2018-10-09 ENCOUNTER — Other Ambulatory Visit: Payer: Self-pay

## 2018-10-09 VITALS — BP 142/80 | HR 92 | Ht 71.0 in | Wt 225.8 lb

## 2018-10-09 DIAGNOSIS — I482 Chronic atrial fibrillation, unspecified: Secondary | ICD-10-CM | POA: Diagnosis not present

## 2018-10-09 DIAGNOSIS — F1010 Alcohol abuse, uncomplicated: Secondary | ICD-10-CM | POA: Diagnosis not present

## 2018-10-09 DIAGNOSIS — I4819 Other persistent atrial fibrillation: Secondary | ICD-10-CM | POA: Diagnosis not present

## 2018-10-09 DIAGNOSIS — I10 Essential (primary) hypertension: Secondary | ICD-10-CM | POA: Diagnosis not present

## 2018-10-09 NOTE — Patient Instructions (Signed)
Medication Instructions:  NOT NEEDED If you need a refill on your cardiac medications before your next appointment, please call your pharmacy.   Lab work: NOT NEEDED If you have labs (blood work) drawn today and your tests are completely normal, you will receive your results only by: Marland Kitchen MyChart Message (if you have MyChart) OR . A paper copy in the mail If you have any lab test that is abnormal or we need to change your treatment, we will call you to review the results.  Testing/Procedures: NOT NEEDED  Follow-Up: At [facility] you and your health needs are our priority.  As part of our continuing mission to provide you with exceptional heart care, we have created designated Provider Care Teams.  These Care Teams include your primary Cardiologist (physician) and Advanced Practice Providers (APPs -  Physician Assistants and Nurse Practitioners) who all work together to provide you with the care you need, when you need it. You will need a follow up appointment in 12 months MARCH 2021.  Please call our office 2 months in advance to schedule this appointment.  You may see Pixie Casino, MD  or one of the following Advanced Practice Providers on your designated Care Team: [place], [place] . Fabian Sharp, PA-C  Any Other Special Instructions Will Be Listed Below (If Applicable).

## 2018-10-09 NOTE — Addendum Note (Signed)
Addended by: Raiford Simmonds on: 10/09/2018 03:15 PM   Modules accepted: Orders
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
## 2018-10-30 ENCOUNTER — Telehealth: Payer: Self-pay

## 2018-10-30 MED ORDER — XARELTO 20 MG PO TABS: 20.0000 mg | ORAL_TABLET | Freq: Every day | ORAL | 0 refills | Status: DC

## 2018-10-30 NOTE — Telephone Encounter (Signed)
Refill sent.

## 2018-11-09 ENCOUNTER — Other Ambulatory Visit: Payer: Self-pay | Admitting: Internal Medicine

## 2018-11-09 DIAGNOSIS — E7800 Pure hypercholesterolemia, unspecified: Secondary | ICD-10-CM

## 2019-01-17 ENCOUNTER — Other Ambulatory Visit: Payer: Self-pay | Admitting: Internal Medicine

## 2019-01-22 ENCOUNTER — Other Ambulatory Visit: Payer: Self-pay | Admitting: Internal Medicine

## 2019-01-22 ENCOUNTER — Other Ambulatory Visit: Payer: Self-pay

## 2019-01-22 DIAGNOSIS — E7800 Pure hypercholesterolemia, unspecified: Secondary | ICD-10-CM

## 2019-01-22 MED ORDER — ATORVASTATIN CALCIUM 40 MG PO TABS: 40.0000 mg | ORAL_TABLET | Freq: Every day | ORAL | 2 refills | Status: DC

## 2019-01-22 MED ORDER — XARELTO 20 MG PO TABS: 20.0000 mg | ORAL_TABLET | Freq: Every day | ORAL | 2 refills | Status: DC

## 2019-01-22 MED ORDER — HYDROCHLOROTHIAZIDE 25 MG PO TABS: 25.0000 mg | ORAL_TABLET | Freq: Every day | ORAL | 2 refills | Status: DC

## 2019-01-22 MED ORDER — DILTIAZEM HCL ER COATED BEADS 300 MG PO TB24: 300.0000 mg | ORAL_TABLET | Freq: Every day | ORAL | 2 refills | Status: DC

## 2019-01-22 MED ORDER — METOPROLOL SUCCINATE ER 50 MG PO TB24: ORAL_TABLET | ORAL | 2 refills | Status: DC

## 2019-01-22 MED ORDER — LOSARTAN POTASSIUM 100 MG PO TABS: 100.0000 mg | ORAL_TABLET | Freq: Every day | ORAL | 2 refills | Status: DC

## 2019-03-09 ENCOUNTER — Other Ambulatory Visit: Payer: Self-pay | Admitting: Internal Medicine

## 2019-08-25 ENCOUNTER — Ambulatory Visit: Payer: Medicare Other

## 2019-09-02 ENCOUNTER — Ambulatory Visit: Payer: Medicare Other | Attending: Internal Medicine

## 2019-09-02 DIAGNOSIS — Z23 Encounter for immunization: Secondary | ICD-10-CM

## 2019-09-02 NOTE — Progress Notes (Signed)
   Covid-19 Vaccination Clinic  Name:  Thomas Patterson    MRN: [id number] DOB: 1951/06/03  09/02/2019  Thomas Patterson was observed post Covid-19 immunization for 15 minutes without incidence. He was provided with Vaccine Information Sheet and instruction to access the V-Safe system.   Thomas Patterson was instructed to call 911 with any severe reactions post vaccine: Marland Kitchen Difficulty breathing  . Swelling of your face and throat  . A fast heartbeat  . A bad rash all over your body  . Dizziness and weakness    Immunizations Administered    Name Date Dose VIS Date Route   Pfizer COVID-19 Vaccine 09/02/2019 12:10 PM 0.3 mL 07/06/2019 Intramuscular   Manufacturer: [place]   Lot: [id number]   [place]: [id number]

## 2019-09-05 ENCOUNTER — Ambulatory Visit: Payer: Medicare Other

## 2019-09-27 ENCOUNTER — Ambulatory Visit: Payer: Medicare Other | Attending: Internal Medicine

## 2019-09-27 DIAGNOSIS — Z23 Encounter for immunization: Secondary | ICD-10-CM

## 2019-09-27 NOTE — Progress Notes (Signed)
   Covid-19 Vaccination Clinic  Name:  Thomas Patterson    MRN: [id number] DOB: [birth year]  09/27/2019  Mr. Thomas Patterson was observed post Covid-19 immunization for 15 minutes without incident. He was provided with Vaccine Information Sheet and instruction to access the V-Safe system.   Mr. Thomas Patterson was instructed to call 911 with any severe reactions post vaccine: Marland Kitchen Difficulty breathing  . Swelling of face and throat  . A fast heartbeat  . A bad rash all over body  . Dizziness and weakness   Immunizations Administered    Name Date Dose VIS Date Route   Pfizer COVID-19 Vaccine 09/27/2019 11:10 AM 0.3 mL 07/06/2019 Intramuscular   Manufacturer: [place]   Lot: [id number]   [place]: [id number]

## 2019-10-10 ENCOUNTER — Emergency Department (HOSPITAL_COMMUNITY): Payer: Medicare Other

## 2019-10-10 ENCOUNTER — Observation Stay (HOSPITAL_COMMUNITY)
Admission: EM | Admit: 2019-10-10 | Discharge: 2019-10-11 | Disposition: A | Discharge status: Home / Self Care | Payer: Medicare Other | Attending: Family Medicine | Admitting: Family Medicine

## 2019-10-10 ENCOUNTER — Other Ambulatory Visit: Payer: Self-pay

## 2019-10-10 ENCOUNTER — Encounter (HOSPITAL_COMMUNITY): Payer: Self-pay | Admitting: Emergency Medicine

## 2019-10-10 DIAGNOSIS — Z20822 Contact with and (suspected) exposure to covid-19: Secondary | ICD-10-CM | POA: Diagnosis not present

## 2019-10-10 DIAGNOSIS — Z8546 Personal history of malignant neoplasm of prostate: Secondary | ICD-10-CM | POA: Insufficient documentation

## 2019-10-10 DIAGNOSIS — Z85828 Personal history of other malignant neoplasm of skin: Secondary | ICD-10-CM | POA: Insufficient documentation

## 2019-10-10 DIAGNOSIS — K760 Fatty (change of) liver, not elsewhere classified: Secondary | ICD-10-CM | POA: Insufficient documentation

## 2019-10-10 DIAGNOSIS — R110 Nausea: Secondary | ICD-10-CM | POA: Diagnosis present

## 2019-10-10 DIAGNOSIS — C61 Malignant neoplasm of prostate: Secondary | ICD-10-CM | POA: Diagnosis present

## 2019-10-10 DIAGNOSIS — R7989 Other specified abnormal findings of blood chemistry: Secondary | ICD-10-CM | POA: Diagnosis not present

## 2019-10-10 DIAGNOSIS — R631 Polydipsia: Secondary | ICD-10-CM | POA: Insufficient documentation

## 2019-10-10 DIAGNOSIS — F1010 Alcohol abuse, uncomplicated: Secondary | ICD-10-CM | POA: Diagnosis not present

## 2019-10-10 DIAGNOSIS — Z79899 Other long term (current) drug therapy: Secondary | ICD-10-CM | POA: Insufficient documentation

## 2019-10-10 DIAGNOSIS — I4819 Other persistent atrial fibrillation: Secondary | ICD-10-CM | POA: Insufficient documentation

## 2019-10-10 DIAGNOSIS — E860 Dehydration: Secondary | ICD-10-CM | POA: Diagnosis not present

## 2019-10-10 DIAGNOSIS — R358 Other polyuria: Secondary | ICD-10-CM | POA: Insufficient documentation

## 2019-10-10 DIAGNOSIS — N179 Acute kidney failure, unspecified: Principal | ICD-10-CM | POA: Diagnosis present

## 2019-10-10 DIAGNOSIS — Z7901 Long term (current) use of anticoagulants: Secondary | ICD-10-CM | POA: Insufficient documentation

## 2019-10-10 DIAGNOSIS — I10 Essential (primary) hypertension: Secondary | ICD-10-CM | POA: Diagnosis not present

## 2019-10-10 DIAGNOSIS — E876 Hypokalemia: Secondary | ICD-10-CM | POA: Diagnosis not present

## 2019-10-10 LAB — CBC WITH DIFFERENTIAL/PLATELET
Abs Immature Granulocytes: 0.03 10*3/uL (ref 0.00–0.07)
Basophils Absolute: 0 10*3/uL (ref 0.0–0.1)
Basophils Relative: 1 %
Eosinophils Absolute: 0 10*3/uL (ref 0.0–0.5)
Eosinophils Relative: 0 %
HCT: 39.4 % (ref 39.0–52.0)
Hemoglobin: 13.6 g/dL (ref 13.0–17.0)
Immature Granulocytes: 0 %
Lymphocytes Relative: 16 %
Lymphs Abs: 1.1 10*3/uL (ref 0.7–4.0)
MCH: 32.1 pg (ref 26.0–34.0)
MCHC: 34.5 g/dL (ref 30.0–36.0)
MCV: 92.9 fL (ref 80.0–100.0)
Monocytes Absolute: 0.6 10*3/uL (ref 0.1–1.0)
Monocytes Relative: 9 %
Neutro Abs: 5.1 10*3/uL (ref 1.7–7.7)
Neutrophils Relative %: 74 %
Platelets: 157 10*3/uL (ref 150–400)
RBC: 4.24 MIL/uL (ref 4.22–5.81)
RDW: 14.3 % (ref 11.5–15.5)
WBC: 7 10*3/uL (ref 4.0–10.5)
nRBC: 0 % (ref 0.0–0.2)

## 2019-10-10 LAB — COMPREHENSIVE METABOLIC PANEL
ALT: 82 U/L — ABNORMAL HIGH (ref 0–44)
AST: 114 U/L — ABNORMAL HIGH (ref 15–41)
Albumin: 3.3 g/dL — ABNORMAL LOW (ref 3.5–5.0)
Alkaline Phosphatase: 180 U/L — ABNORMAL HIGH (ref 38–126)
Anion gap: 16 — ABNORMAL HIGH (ref 5–15)
BUN: 68 mg/dL — ABNORMAL HIGH (ref 8–23)
CO2: 22 mmol/L (ref 22–32)
Calcium: 8.6 mg/dL — ABNORMAL LOW (ref 8.9–10.3)
Chloride: 98 mmol/L (ref 98–111)
Creatinine, Ser: 2.6 mg/dL — ABNORMAL HIGH (ref 0.61–1.24)
GFR calc Af Amer: 28 mL/min — ABNORMAL LOW (ref >60)
GFR calc non Af Amer: 24 mL/min — ABNORMAL LOW (ref >60)
Glucose, Bld: 99 mg/dL (ref 70–99)
Potassium: 3.2 mmol/L — ABNORMAL LOW (ref 3.5–5.1)
Sodium: 136 mmol/L (ref 135–145)
Total Bilirubin: 1.3 mg/dL — ABNORMAL HIGH (ref 0.3–1.2)
Total Protein: 7.6 g/dL (ref 6.5–8.1)

## 2019-10-10 LAB — BLOOD GAS, VENOUS
Acid-base deficit: 1.2 mmol/L (ref 0.0–2.0)
Bicarbonate: 23.2 mmol/L (ref 20.0–28.0)
O2 Saturation: 56.7 %
Patient temperature: 98.6
pCO2, Ven: 39.5 mmHg — ABNORMAL LOW (ref 44.0–60.0)
pH, Ven: 7.386 (ref 7.250–7.430)
pO2, Ven: 35.3 mmHg (ref 32.0–45.0)

## 2019-10-10 LAB — URINALYSIS, ROUTINE W REFLEX MICROSCOPIC
Bilirubin Urine: NEGATIVE
Glucose, UA: 50 mg/dL — AB
Hgb urine dipstick: NEGATIVE
Ketones, ur: NEGATIVE mg/dL
Leukocytes,Ua: NEGATIVE
Nitrite: NEGATIVE
Protein, ur: NEGATIVE mg/dL
Specific Gravity, Urine: 1.014 (ref 1.005–1.030)
pH: 6 (ref 5.0–8.0)

## 2019-10-10 LAB — HIV ANTIBODY (ROUTINE TESTING W REFLEX): HIV Screen 4th Generation wRfx: NONREACTIVE

## 2019-10-10 LAB — PROTIME-INR
INR: 2.4 — ABNORMAL HIGH (ref 0.8–1.2)
Prothrombin Time: 26.4 seconds — ABNORMAL HIGH (ref 11.4–15.2)

## 2019-10-10 LAB — MAGNESIUM: Magnesium: 1.6 mg/dL — ABNORMAL LOW (ref 1.7–2.4)

## 2019-10-10 LAB — SARS CORONAVIRUS 2 (TAT 6-24 HRS): SARS Coronavirus 2: NEGATIVE

## 2019-10-10 LAB — PHOSPHORUS: Phosphorus: 3.3 mg/dL (ref 2.5–4.6)

## 2019-10-10 MED ORDER — THIAMINE HCL 100 MG PO TABS
100.0000 mg | ORAL_TABLET | Freq: Every day | ORAL | Status: DC
  Administered 2019-10-10 – 2019-10-11 (×2): 100 mg via ORAL
  Filled 2019-10-10 (×2): qty 1

## 2019-10-10 MED ORDER — PANTOPRAZOLE SODIUM 40 MG PO TBEC
40.0000 mg | DELAYED_RELEASE_TABLET | Freq: Every day | ORAL | Status: DC
  Administered 2019-10-10 – 2019-10-11 (×2): 40 mg via ORAL
  Filled 2019-10-10 (×2): qty 1

## 2019-10-10 MED ORDER — LORAZEPAM 2 MG/ML IJ SOLN: 0.0000 mg | Freq: Two times a day (BID) | INTRAMUSCULAR | Status: DC

## 2019-10-10 MED ORDER — ACETAMINOPHEN 650 MG RE SUPP: 650.0000 mg | Freq: Four times a day (QID) | RECTAL | Status: DC | PRN

## 2019-10-10 MED ORDER — ACETAMINOPHEN 325 MG PO TABS: 650.0000 mg | ORAL_TABLET | Freq: Four times a day (QID) | ORAL | Status: DC | PRN

## 2019-10-10 MED ORDER — LORAZEPAM 2 MG/ML IJ SOLN: 0.0000 mg | Freq: Four times a day (QID) | INTRAMUSCULAR | Status: DC

## 2019-10-10 MED ORDER — ONDANSETRON HCL 4 MG PO TABS: 4.0000 mg | ORAL_TABLET | Freq: Four times a day (QID) | ORAL | Status: DC | PRN

## 2019-10-10 MED ORDER — THIAMINE HCL 100 MG/ML IJ SOLN: 100.0000 mg | Freq: Every day | INTRAMUSCULAR | Status: DC

## 2019-10-10 MED ORDER — SODIUM CHLORIDE 0.9 % IV BOLUS
1000.0000 mL | Freq: Once | INTRAVENOUS | Status: AC
  Administered 2019-10-10: 1000 mL via INTRAVENOUS

## 2019-10-10 MED ORDER — POTASSIUM CHLORIDE IN NACL 40-0.9 MEQ/L-% IV SOLN
INTRAVENOUS | Status: DC
  Administered 2019-10-10 – 2019-10-11 (×2): 100 mL/h via INTRAVENOUS
  Filled 2019-10-10 (×3): qty 1000

## 2019-10-10 MED ORDER — LORAZEPAM 1 MG PO TABS: 0.0000 mg | ORAL_TABLET | Freq: Four times a day (QID) | ORAL | Status: DC

## 2019-10-10 MED ORDER — LORAZEPAM 1 MG PO TABS: 0.0000 mg | ORAL_TABLET | Freq: Two times a day (BID) | ORAL | Status: DC

## 2019-10-10 MED ORDER — ONDANSETRON HCL 4 MG/2ML IJ SOLN: 4.0000 mg | Freq: Four times a day (QID) | INTRAMUSCULAR | Status: DC | PRN

## 2019-10-10 MED ORDER — POTASSIUM CHLORIDE CRYS ER 20 MEQ PO TBCR
40.0000 meq | EXTENDED_RELEASE_TABLET | Freq: Once | ORAL | Status: AC
  Administered 2019-10-10: 40 meq via ORAL
  Filled 2019-10-10: qty 2

## 2019-10-10 MED ORDER — MAGNESIUM SULFATE 2 GM/50ML IV SOLN
2.0000 g | Freq: Once | INTRAVENOUS | Status: AC
  Administered 2019-10-10: 2 g via INTRAVENOUS
  Filled 2019-10-10: qty 50

## 2019-10-10 NOTE — ED Triage Notes (Signed)
Patient reports sent from PCP for admission for AKI after seen in office yesterday. C/o intermittent nausea x3 weeks. Denies pain.

## 2019-10-10 NOTE — ED Notes (Signed)
[place] at bedside.  Wife at bedside.
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
## 2019-10-11 ENCOUNTER — Other Ambulatory Visit: Payer: Self-pay | Admitting: Internal Medicine

## 2019-10-11 DIAGNOSIS — N179 Acute kidney failure, unspecified: Secondary | ICD-10-CM

## 2019-10-11 DIAGNOSIS — C61 Malignant neoplasm of prostate: Secondary | ICD-10-CM | POA: Diagnosis not present

## 2019-10-11 DIAGNOSIS — I10 Essential (primary) hypertension: Secondary | ICD-10-CM

## 2019-10-11 DIAGNOSIS — I4819 Other persistent atrial fibrillation: Secondary | ICD-10-CM

## 2019-10-11 DIAGNOSIS — F1010 Alcohol abuse, uncomplicated: Secondary | ICD-10-CM | POA: Diagnosis not present

## 2019-10-11 LAB — COMPREHENSIVE METABOLIC PANEL
ALT: 66 U/L — ABNORMAL HIGH (ref 0–44)
AST: 86 U/L — ABNORMAL HIGH (ref 15–41)
Albumin: 2.8 g/dL — ABNORMAL LOW (ref 3.5–5.0)
Alkaline Phosphatase: 113 U/L (ref 38–126)
Anion gap: 7 (ref 5–15)
BUN: 37 mg/dL — ABNORMAL HIGH (ref 8–23)
CO2: 24 mmol/L (ref 22–32)
Calcium: 7.7 mg/dL — ABNORMAL LOW (ref 8.9–10.3)
Chloride: 106 mmol/L (ref 98–111)
Creatinine, Ser: 1.62 mg/dL — ABNORMAL HIGH (ref 0.61–1.24)
GFR calc Af Amer: 50 mL/min — ABNORMAL LOW (ref >60)
GFR calc non Af Amer: 43 mL/min — ABNORMAL LOW (ref >60)
Glucose, Bld: 97 mg/dL (ref 70–99)
Potassium: 3.7 mmol/L (ref 3.5–5.1)
Sodium: 137 mmol/L (ref 135–145)
Total Bilirubin: 1.4 mg/dL — ABNORMAL HIGH (ref 0.3–1.2)
Total Protein: 6.2 g/dL — ABNORMAL LOW (ref 6.5–8.1)

## 2019-10-11 LAB — CBC
HCT: 34 % — ABNORMAL LOW (ref 39.0–52.0)
Hemoglobin: 11.6 g/dL — ABNORMAL LOW (ref 13.0–17.0)
MCH: 32.5 pg (ref 26.0–34.0)
MCHC: 34.1 g/dL (ref 30.0–36.0)
MCV: 95.2 fL (ref 80.0–100.0)
Platelets: 134 10*3/uL — ABNORMAL LOW (ref 150–400)
RBC: 3.57 MIL/uL — ABNORMAL LOW (ref 4.22–5.81)
RDW: 14.7 % (ref 11.5–15.5)
WBC: 5.9 10*3/uL (ref 4.0–10.5)
nRBC: 0 % (ref 0.0–0.2)

## 2019-10-11 LAB — MAGNESIUM: Magnesium: 1.8 mg/dL (ref 1.7–2.4)

## 2019-10-11 MED ORDER — LOSARTAN POTASSIUM 100 MG PO TABS: ORAL_TABLET | ORAL | Status: DC

## 2019-10-11 MED ORDER — CHLORDIAZEPOXIDE HCL 25 MG PO CAPS: ORAL_CAPSULE | ORAL | 0 refills | Status: AC

## 2019-10-11 NOTE — Care Management CC44 (Signed)
Condition Code 44 Documentation Completed  Patient Details  Name: Thomas Patterson MRN: [id number] Date of Birth: 03-Jul-1951   Condition Code 44 given:  Yes Patient signature on Condition Code 44 notice:  Yes Documentation of 2 MD's agreement:  Yes Code 44 added to claim:  Yes    Fryda Molenda, LCSW 10/11/2019, 12:02 PM

## 2019-10-11 NOTE — Discharge Summary (Addendum)
Physician Discharge Summary  Thomas Patterson [id number] DOB: June 12, 1951 DOA: 10/10/2019  PCP: Hulan Fess, MD  Admit date: 10/10/2019 Discharge date: 10/11/2019  Admitted From: Home Disposition: Home  Recommendations for Outpatient Follow-up:  1. Follow up with PCP in 1 week 2. Please obtain BMP in 4 days 3. Please follow up on the following pending results: None  Home Health: None Equipment/Devices: None  Discharge Condition: Stable CODE STATUS: Full code Diet recommendation: Heart healthy   Brief/Interim Summary:  Admission HPI written by Debbe Odea, MD   Chief Complaint: sent from PCP for AKI  HPI: Thomas Patterson is a 69 y.o. male with medical history of ETOH use, HTN, LVH, A-fib on Xarelto who presents for AKI and nausea for 3 wks. The patient states that he has had a steady onset of nausea but cannot recall the initial start of it. Over the past week his nausea has been severe. He believes he has lost about 10 lbs. He typically develops nausea after taking his AM meds. He often vomits up clear liquids later in the AM but does not vomit up his meds. He has small snacks until dinnertime where he has a normal dinner. Around 5 PM he gets nauseated again and vomits up a clear liquid. His wife states it sounds like he is coughing and then he vomits. He has no abdominal pain, diarrhea or constipation. He drinks a lot of water and urinates a lot. He also drinks 2-4 beers a day and has been doing so for many years.   Hospital course:  AKI Likely secondary to dehydration from polyuria and decreased oral intake. Patient reports polydipsia associated with symptoms. Fasting blood sugar was 97 on day of discharge. Creatinine on admission of 2.6 and improved to 1.62 with IV fluids and while holding losartan and hydrochlorothiazide. Patient without nausea/vomiting and so was stable for discharge with recommendations for close outpatient follow-up/labwork. Baseline creatinine of 1 from  2019.  Essential hypertension On losartan, hydrochlorothiazide and Diltiazem. No antihypertensives were continued on admission. Continue diltiazem and hold losartan until repeat BMP obtained. Discontinued hydrochlorothiazide on discharge. Blood pressure mostly controlled while inpatient if not mildly uncontrolled.  Persistent atrial fibrillation Patient is on Xarelto and Diltiazem as an outpatient. Xarelto held on admission secondary to above. Creatinine improved prior to discharge and patient instructed to resume Xarelto with his evening meal as this is his largest meal.  Hypokalemia Hypomagnesemia Supplementation given and resolved.  Fatty liver disease Likely secondary to alcohol use. Patient with an INR of 2.4 and bilirubin of 1.4. Discussed importance of alcohol cessation. Patient follows with GI at [facility] and I have recommended he follow-up with them for continued recommendations.  Alcohol use History of significant use. Patient states he does not drink too much but does drink a couple of beers per day every day. He likes to drink socially. As mentioned above, recommendations for alcohol cessation. Patient reports never having alcohol withdrawal. Patient given a prescription for Librium prn symptoms. Information given with regard to signs/symptoms of alcohol withdrawal. Instructed to call PCP if concern for withdrawal symptoms start.   Malignant neoplasm of prostate History of prostatectomy.    Discharge Diagnoses:  Principal Problem:   AKI (acute kidney injury) ([place]) Active Problems:   Essential hypertension   Persistent atrial fibrillation ([facility])   ETOH abuse   Malignant neoplasm of prostate [facility])    Discharge Instructions  Discharge Instructions    Diet - low sodium heart healthy   Complete  by: As directed    Increase activity slowly   Complete by: As directed      Allergies as of 10/11/2019   No Known Allergies     Medication List    STOP taking these  medications   hydrochlorothiazide 25 MG tablet Commonly known as: HYDRODIURIL   metoprolol succinate 50 MG 24 hr tablet Commonly known as: TOPROL-XL     TAKE these medications   atorvastatin 40 MG tablet Commonly known as: LIPITOR Take 1 tablet (40 mg total) by mouth daily.   chlordiazePOXIDE 25 MG capsule Commonly known as: LIBRIUM Take 2 capsules (50 mg total) by mouth 4 (four) times daily as needed for 1 day for withdrawal, THEN 1 capsule (25 mg total) 4 (four) times daily as needed for up to 4 days for withdrawal. Start taking on: October 11, 2019   diltiazem 300 MG 24 hr tablet Commonly known as: CARDIZEM LA TAKE 1 TABLET BY MOUTH  DAILY   losartan 100 MG tablet Commonly known as: COZAAR Please hold this medication until you see your PCP/get repeat metabolic panel for kidney function What changed:   how much to take  how to take this  when to take this  additional instructions   omeprazole 20 MG capsule Commonly known as: PRILOSEC Take 20 mg by mouth daily as needed (indigestion).   polyvinyl alcohol 1.4 % ophthalmic solution Commonly known as: LIQUIFILM TEARS Place 1 drop into both eyes as needed for dry eyes.   Xarelto 20 MG Tabs tablet Generic drug: rivaroxaban Take 1 tablet (20 mg total) by mouth daily with supper.       No Known Allergies  Consultations:  None   Procedures/Studies: US Abdomen Complete  Result Date: 10/10/2019 CLINICAL DATA:  Elevated LFTs.  Acute kidney injury. EXAM: ABDOMEN ULTRASOUND COMPLETE COMPARISON:  None FINDINGS: Gallbladder: No gallstones or wall thickening visualized. No sonographic Murphy sign noted by sonographer. Common bile duct: Diameter: Normal caliber, 3 mm Liver: Heterogeneous, increased echotexture throughout the liver which appears prominent. No focal hepatic abnormality. Portal vein is patent on color Doppler imaging with normal direction of blood flow towards the liver. IVC: No abnormality visualized.  Pancreas: Visualized portion unremarkable. Spleen: Size and appearance within normal limits. Right Kidney: Length: 13.1 cm. Echogenicity within normal limits. No mass or hydronephrosis visualized. Left Kidney: Length: 12.1 cm. Echogenicity within normal limits. No mass or hydronephrosis visualized. Abdominal aorta: No aneurysm visualized. Other findings: None. IMPRESSION: Prominent appearing liver with increased echotexture compatible with fatty infiltration. Electronically Signed   By: Rolm Baptise M.D.   On: 10/10/2019 14:09     Subjective: No issues overnight. Feels better. No nausea or vomiting.  Discharge Exam: Vitals:   10/10/19 2347 10/11/19 0414  BP: (!) 141/77 115/75  Pulse: 91 89  Resp: 17 17  Temp: 98.5 F (36.9 C) 98.2 F (36.8 C)  SpO2: 99% 96%   Vitals:   10/10/19 1750 10/10/19 2106 10/10/19 2347 10/11/19 0414  BP: 137/77 139/73 (!) 141/77 115/75  Pulse: 92 84 91 89  Resp: 20 17 17 17   Temp: 98.7 F (37.1 C) 99.1 F (37.3 C) 98.5 F (36.9 C) 98.2 F (36.8 C)  TempSrc: Oral Oral Oral Oral  SpO2: 99% 98% 99% 96%    General: Pt is alert, awake, not in acute distress Cardiovascular: Normal rate, S1/S2 +, no rubs, no gallops Respiratory: CTA bilaterally, no wheezing, no rhonchi Abdominal: Soft, NT, ND, bowel sounds + Extremities: no edema, no cyanosis  The results of significant diagnostics from this hospitalization (including imaging, microbiology, ancillary and laboratory) are listed below for reference.     Microbiology: Recent Results (from the past 240 hour(s))  SARS CORONAVIRUS 2 (TAT 6-24 HRS) Nasopharyngeal Urine, Clean Catch     Status: None   Collection Time: 10/10/19 12:27 PM   Specimen: Urine, Clean Catch; Nasopharyngeal  Result Value Ref Range Status   SARS Coronavirus 2 NEGATIVE NEGATIVE Final    Comment: (NOTE) SARS-CoV-2 target nucleic acids are NOT DETECTED. The SARS-CoV-2 RNA is generally detectable in upper and lower respiratory  specimens during the acute phase of infection. Negative results do not preclude SARS-CoV-2 infection, do not rule out co-infections with other pathogens, and should not be used as the sole basis for treatment or other patient management decisions. Negative results must be combined with clinical observations, patient history, and epidemiological information. The expected result is Negative. Fact Sheet for Patients: SugarRoll.be Fact Sheet for Healthcare Providers: https://www.woods-mathews.com/ This test is not yet approved or cleared by the [place] FDA and  has been authorized for detection and/or diagnosis of SARS-CoV-2 by FDA under an Emergency Use Authorization (EUA). This EUA will remain  in effect (meaning this test can be used) for the duration of the COVID-19 declaration under Section 56 4(b)(1) of the Act, 21 U.S.C. section 360bbb-3(b)(1), unless the authorization is terminated or revoked sooner. Performed at [facility], [place] [address], [place], [place] [address]      Labs: BNP (last 3 results) No results for input(s): BNP in the last 8760 hours. Basic Metabolic Panel: Recent Labs  Lab 10/10/19 1227 10/11/19 0549  NA 136 137  K 3.2* 3.7  CL 98 106  CO2 22 24  GLUCOSE 99 97  BUN 68* 37*  CREATININE 2.60* 1.62*  CALCIUM 8.6* 7.7*  MG 1.6* 1.8  PHOS 3.3  --    Liver Function Tests: Recent Labs  Lab 10/10/19 1227 10/11/19 0549  AST 114* 86*  ALT 82* 66*  ALKPHOS 180* 113  BILITOT 1.3* 1.4*  PROT 7.6 6.2*  ALBUMIN 3.3* 2.8*   No results for input(s): LIPASE, AMYLASE in the last 168 hours. No results for input(s): AMMONIA in the last 168 hours. CBC: Recent Labs  Lab 10/10/19 1227 10/11/19 0549  WBC 7.0 5.9  NEUTROABS 5.1  --   HGB 13.6 11.6*  HCT 39.4 34.0*  MCV 92.9 95.2  PLT 157 134*   Cardiac Enzymes: No results for input(s): CKTOTAL, CKMB, CKMBINDEX, TROPONINI in the last 168  hours. BNP: Invalid input(s): POCBNP CBG: No results for input(s): GLUCAP in the last 168 hours. D-Dimer No results for input(s): DDIMER in the last 72 hours. Hgb A1c No results for input(s): HGBA1C in the last 72 hours. Lipid Profile No results for input(s): CHOL, HDL, LDLCALC, TRIG, CHOLHDL, LDLDIRECT in the last 72 hours. Thyroid function studies No results for input(s): TSH, T4TOTAL, T3FREE, THYROIDAB in the last 72 hours.  Invalid input(s): FREET3 Anemia work up No results for input(s): VITAMINB12, FOLATE, FERRITIN, TIBC, IRON, RETICCTPCT in the last 72 hours. Urinalysis    Component Value Date/Time   COLORURINE YELLOW 10/10/2019 1227   APPEARANCEUR CLEAR 10/10/2019 1227   LABSPEC 1.014 10/10/2019 1227   PHURINE 6.0 10/10/2019 1227   GLUCOSEU 50 (A) 10/10/2019 1227   HGBUR NEGATIVE 10/10/2019 [place] 10/10/2019 [place] 10/10/2019 [place] 10/10/2019 1227   NITRITE NEGATIVE 10/10/2019 1227  LEUKOCYTESUR NEGATIVE 10/10/2019 1227   Sepsis Labs Invalid input(s): PROCALCITONIN,  WBC,  LACTICIDVEN Microbiology Recent Results (from the past 240 hour(s))  SARS CORONAVIRUS 2 (TAT 6-24 HRS) Nasopharyngeal Urine, Clean Catch     Status: None   Collection Time: 10/10/19 12:27 PM   Specimen: Urine, Clean Catch; Nasopharyngeal  Result Value Ref Range Status   SARS Coronavirus 2 NEGATIVE NEGATIVE Final    Comment: (NOTE) SARS-CoV-2 target nucleic acids are NOT DETECTED. The SARS-CoV-2 RNA is generally detectable in upper and lower respiratory specimens during the acute phase of infection. Negative results do not preclude SARS-CoV-2 infection, do not rule out co-infections with other pathogens, and should not be used as the sole basis for treatment or other patient management decisions. Negative results must be combined with clinical observations, patient history, and epidemiological information. The expected result is  Negative. Fact Sheet for Patients: SugarRoll.be Fact Sheet for Healthcare Providers: https://www.woods-mathews.com/ This test is not yet approved or cleared by the [place] FDA and  has been authorized for detection and/or diagnosis of SARS-CoV-2 by FDA under an Emergency Use Authorization (EUA). This EUA will remain  in effect (meaning this test can be used) for the duration of the COVID-19 declaration under Section 56 4(b)(1) of the Act, 21 U.S.C. section 360bbb-3(b)(1), unless the authorization is terminated or revoked sooner. Performed at [facility], [place] [address] [place], [place] [address]     SIGNED:   Cordelia Poche, MD [facility] 10/11/2019, 11:32 AM

## 2019-10-11 NOTE — TOC Transition Note (Signed)
Transition of Care [facility]) - CM/SW Discharge Note   Patient Details  Name: Thomas Patterson MRN: [id number] Date of Birth: 10/06/1950  [facility] CM/SW Contact:  Lennart Pall, LCSW Phone Number: 10/11/2019, 2:12 PM   Clinical Narrative:  Requested by MD to see pt for review of SA resources.  Discussed my role with pt and explained MD order, however, pt declines any SA resource info be provided.  States, "Everybody seems to think I have a substance abuse problem."  Pt ready for d/c.     Final next level of care: Home/Self Care Barriers to Discharge: Barriers Resolved   Patient Goals and CMS Choice Patient states their goals for this hospitalization and ongoing recovery are:: to return home   Choice offered to / list presented to : NA  Discharge Placement                       Discharge Plan and Services In-house Referral: Clinical Social Work   Post Acute Care Choice: NA          DME Arranged: N/A DME Agency: NA       HH Arranged: NA HH Agency: NA        Social Determinants of Health (SDOH) Interventions     Readmission Risk Interventions No flowsheet data found.

## 2019-10-11 NOTE — Care Management Obs Status (Signed)
[place] NOTIFICATION   Patient Details  Name: Thomas Patterson MRN: [id number] Date of Birth: 06-23-1951   Medicare Observation Status Notification Given:  Macario Golds, [place] 10/11/2019, 12:02 PM

## 2019-10-11 NOTE — Discharge Instructions (Signed)
Thomas Patterson,  You were in the hospital with kidney injury likely related to some dehydration. Your hydrochlorothiazide has been discontinued. Please also hold your losartan until your primary care physician can recheck your blood work. You were also found to have a fatty liver and some abnormal liver tests. We discussed stopping your drinking completely. I have provided you with Librium to help with possible withdrawal symptoms. I have also included some information about alcohol withdrawal.

## 2019-10-15 ENCOUNTER — Telehealth: Payer: Self-pay | Admitting: *Deleted

## 2019-10-15 NOTE — Telephone Encounter (Signed)
   [facility] Pre-operative Risk Assessment    Request for surgical clearance:  1. What type of surgery is being performed? colonoscopy   2. When is this surgery scheduled? 10/22/19   3. What type of clearance is required (medical clearance vs. Pharmacy clearance to hold med vs. Both)? pharmacy  4. Are there any medications that need to be held prior to surgery and how long?Xarelto x 1-2 days    5. Practice name and name of physician performing surgery? [facility], Dr. Kenton Kingfisher   6. What is your office phone number [phone]    7.   What is your office fax number [phone]  8.   Anesthesia type (None, local, MAC, general) ?    Thomas Patterson 10/15/2019, 4:49 PM  _________________________________________________________________

## 2019-10-15 NOTE — Telephone Encounter (Signed)
Pt has been rescheduled to see Dr. Debara Pickett 10/17/19 @ 11:30. I have cancelled 10/24/19 appt with Dr. Debara Pickett. Pt thanked me for the help and the call. I will forward clearance notes to MD for upcoming appt. Pt also wants to Dr. Debara Pickett about med changes that occurred when he was in the hospital. I will remove from the pre op call back pool.

## 2019-10-15 NOTE — Telephone Encounter (Signed)
   Primary Cardiologist:Kenneth C Hilty, MD  Chart reviewed as part of pre-operative protocol coverage. Patient was last seen in our office over 1 year ago on 10/09/2019. He was also recently hospitalized for AKI and multiple medication changes were made. Because of Thomas Patterson past medical history and time since last visit, he/she will require a follow-up visit in order to better assess preoperative cardiovascular risk. Procedure is scheduled for 10/22/2019 and he has an appointment with Dr. Debara Pickett on 10/24/2019. Will see if we can arrange for patient to be seen with Dr. Debara Pickett or one of the APP's on his team prior to procedure.   Pre-op covering staff: - Please schedule appointment and call patient to inform them. - Please contact requesting surgeon's office via preferred method (i.e, phone, fax) to inform them of need for appointment prior to surgery.  Will also route note to pharmacy pool for input on holding anticoagulant agent as requested below so that this information is available at time of patient's appointment.   Darreld Mclean, PA-C  10/15/2019, 5:03 PM

## 2019-10-16 NOTE — Telephone Encounter (Signed)
Patient with diagnosis of afib on Xarelto for anticoagulation.   Procedure:  colonoscopy  Date of procedure: 10/22/2019  CHADS2-VASc score of  2 (HTN, AGE)  CrCl 62.9 (actual) 53 ml/min (adjusted)  Per office protocol, patient can hold Xarelto for 1-2 days prior to procedure.

## 2019-10-17 ENCOUNTER — Other Ambulatory Visit: Payer: Self-pay

## 2019-10-17 ENCOUNTER — Encounter: Payer: Self-pay | Admitting: Internal Medicine

## 2019-10-17 ENCOUNTER — Ambulatory Visit: Payer: Medicare Other | Admitting: Internal Medicine

## 2019-10-17 VITALS — BP 128/72 | HR 97 | Temp 96.4°F | Ht 71.0 in | Wt 226.0 lb

## 2019-10-17 DIAGNOSIS — I10 Essential (primary) hypertension: Secondary | ICD-10-CM

## 2019-10-17 DIAGNOSIS — Z79899 Other long term (current) drug therapy: Secondary | ICD-10-CM | POA: Diagnosis not present

## 2019-10-17 DIAGNOSIS — F1010 Alcohol abuse, uncomplicated: Secondary | ICD-10-CM | POA: Diagnosis not present

## 2019-10-17 DIAGNOSIS — I482 Chronic atrial fibrillation, unspecified: Secondary | ICD-10-CM | POA: Diagnosis not present

## 2019-10-17 MED ORDER — HYDROCHLOROTHIAZIDE 25 MG PO TABS: 12.5000 mg | ORAL_TABLET | Freq: Every day | ORAL | 3 refills | Status: DC
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
## 2019-10-17 NOTE — Patient Instructions (Signed)
Medication Instructions:  RESUME hydrochlorothiazide 12.5mg  daily Continue other current medications  *If you need a refill on your cardiac medications before your next appointment, please call your pharmacy*   Lab Work: BMET in about 2 weeks  If you have labs (blood work) drawn today and your tests are completely normal, you will receive your results only by: Marland Kitchen MyChart Message (if you have MyChart) OR . A paper copy in the mail If you have any lab test that is abnormal or we need to change your treatment, we will call you to review the results.   Testing/Procedures: NON#   Follow-Up: At [facility], you and your health needs are our priority.  As part of our continuing mission to provide you with exceptional heart care, we have created designated Provider Care Teams.  These Care Teams include your primary Cardiologist (physician) and Advanced Practice Providers (APPs -  Physician Assistants and Nurse Practitioners) who all work together to provide you with the care you need, when you need it.  We recommend signing up for the patient portal called "MyChart".  Sign up information is provided on this After Visit Summary.  MyChart is used to connect with patients for Virtual Visits (Telemedicine).  Patients are able to view lab/test results, encounter notes, upcoming appointments, etc.  Non-urgent messages can be sent to your provider as well.   To learn more about what you can do with MyChart, go to [email or web address].    Your next appointment:   12 month(s)  The format for your next appointment:   Either In Person or Virtual  Provider:   You may see Pixie Casino, MD or one of the following Advanced Practice Providers on your designated Care Team:    Almyra Deforest, PA-C  Fabian Sharp, PA-C or   Roby Lofts, [place]    Other Instructions

## 2019-10-23 ENCOUNTER — Other Ambulatory Visit: Payer: Self-pay | Admitting: Family Medicine

## 2019-10-23 DIAGNOSIS — R748 Abnormal levels of other serum enzymes: Secondary | ICD-10-CM

## 2019-10-24 ENCOUNTER — Ambulatory Visit: Payer: Medicare Other | Admitting: Internal Medicine

## 2019-10-26 MED ORDER — HYDROCHLOROTHIAZIDE 25 MG PO TABS: 25.0000 mg | ORAL_TABLET | Freq: Every day | ORAL | 3 refills | Status: DC

## 2019-11-08 ENCOUNTER — Other Ambulatory Visit: Payer: Self-pay

## 2019-11-08 ENCOUNTER — Ambulatory Visit
Admission: RE | Admit: 2019-11-08 | Discharge: 2019-11-08 | Disposition: A | Discharge status: Home / Self Care | Payer: Medicare Other | Source: Ambulatory Visit | Attending: Family Medicine | Admitting: Family Medicine

## 2019-11-08 DIAGNOSIS — R748 Abnormal levels of other serum enzymes: Secondary | ICD-10-CM

## 2019-11-08 MED ORDER — IOPAMIDOL (ISOVUE-300) INJECTION 61%
100.0000 mL | Freq: Once | INTRAVENOUS | Status: AC | PRN
  Administered 2019-11-08: 100 mL via INTRAVENOUS

## 2020-01-24 ENCOUNTER — Other Ambulatory Visit: Payer: Self-pay | Admitting: Internal Medicine

## 2020-01-24 DIAGNOSIS — E7800 Pure hypercholesterolemia, unspecified: Secondary | ICD-10-CM

## 2020-04-06 ENCOUNTER — Other Ambulatory Visit: Payer: Self-pay | Admitting: Internal Medicine

## 2020-04-22 ENCOUNTER — Other Ambulatory Visit: Payer: Self-pay | Admitting: Internal Medicine

## 2020-08-05 DIAGNOSIS — I4891 Unspecified atrial fibrillation: Secondary | ICD-10-CM | POA: Diagnosis not present

## 2020-08-05 DIAGNOSIS — I482 Chronic atrial fibrillation, unspecified: Secondary | ICD-10-CM | POA: Diagnosis not present

## 2020-08-05 DIAGNOSIS — E785 Hyperlipidemia, unspecified: Secondary | ICD-10-CM | POA: Diagnosis not present

## 2020-08-05 DIAGNOSIS — I480 Paroxysmal atrial fibrillation: Secondary | ICD-10-CM | POA: Diagnosis not present

## 2020-08-05 DIAGNOSIS — N179 Acute kidney failure, unspecified: Secondary | ICD-10-CM | POA: Diagnosis not present

## 2020-08-05 DIAGNOSIS — I10 Essential (primary) hypertension: Secondary | ICD-10-CM | POA: Diagnosis not present

## 2020-08-06 DIAGNOSIS — I10 Essential (primary) hypertension: Secondary | ICD-10-CM | POA: Diagnosis not present

## 2020-08-06 DIAGNOSIS — R7301 Impaired fasting glucose: Secondary | ICD-10-CM | POA: Diagnosis not present

## 2020-08-07 DIAGNOSIS — E785 Hyperlipidemia, unspecified: Secondary | ICD-10-CM | POA: Diagnosis not present

## 2020-08-07 DIAGNOSIS — R7303 Prediabetes: Secondary | ICD-10-CM | POA: Diagnosis not present

## 2020-08-07 DIAGNOSIS — I77819 Aortic ectasia, unspecified site: Secondary | ICD-10-CM | POA: Diagnosis not present

## 2020-08-07 DIAGNOSIS — Z0000 Encounter for general adult medical examination without abnormal findings: Secondary | ICD-10-CM | POA: Diagnosis not present

## 2020-08-07 DIAGNOSIS — D6869 Other thrombophilia: Secondary | ICD-10-CM | POA: Diagnosis not present

## 2020-08-07 DIAGNOSIS — Z86010 Personal history of colonic polyps: Secondary | ICD-10-CM | POA: Diagnosis not present

## 2020-08-07 DIAGNOSIS — D229 Melanocytic nevi, unspecified: Secondary | ICD-10-CM | POA: Diagnosis not present

## 2020-09-03 IMAGING — US US ABDOMINAL AORTA SCREENING AAA
1 series · 8 of 8 positions shown · non-contrast
Comparison: None.

CLINICAL DATA: Appropriate age.  First time exam.  Smoking history.

EXAM:
US ABDOMINAL AORTA MEDICARE SCREENING
TECHNIQUE: Ultrasound examination of the abdominal aorta was performed as a
screening evaluation for abdominal aortic aneurysm.

[Series 1: us abdominal aorta screening aaa · 0.26mm/px · 8 of 8 slices shown]
[im 1/8]
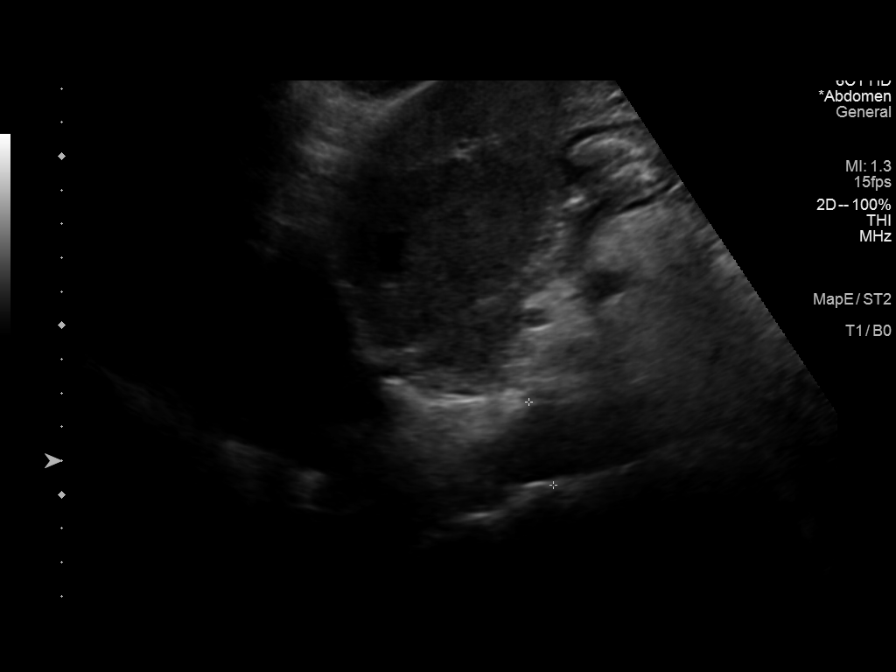
[im 2/8]
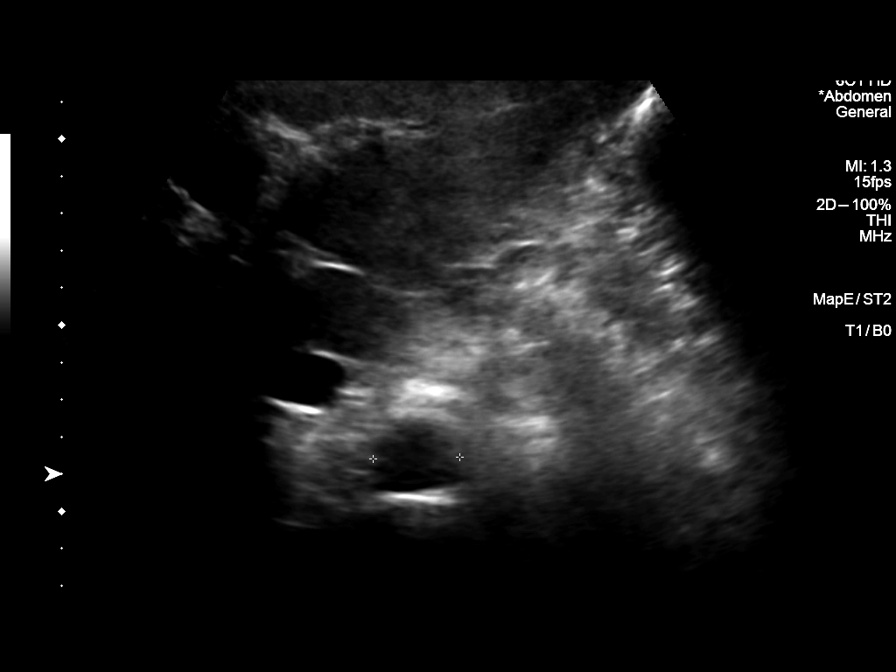
[im 3/8]
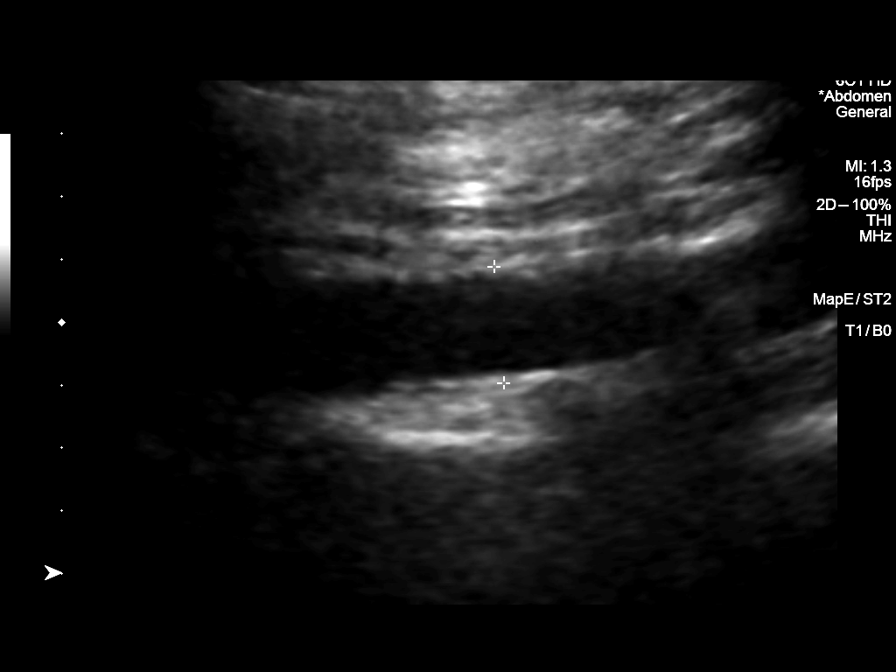
[im 4/8]
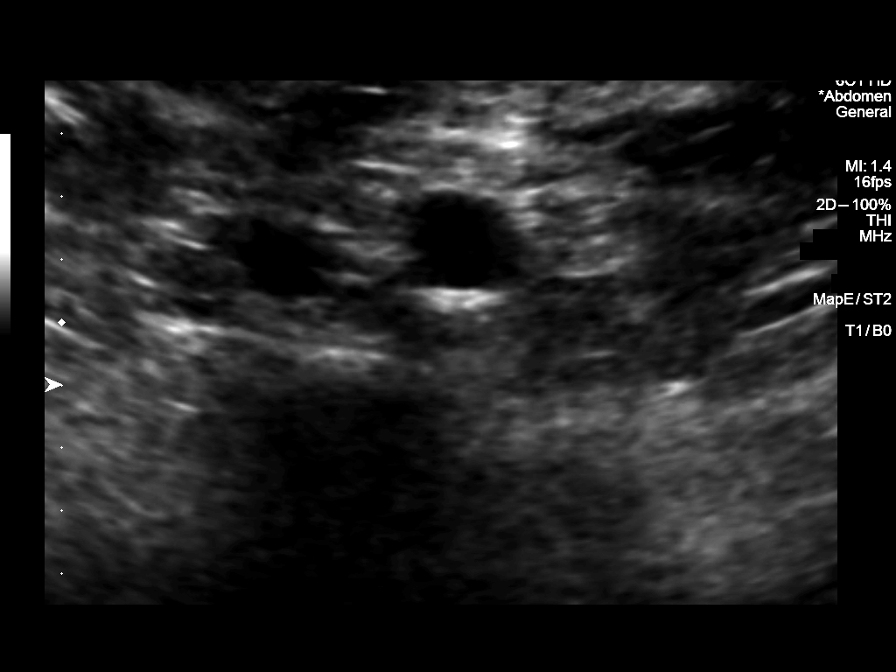
[im 5/8]
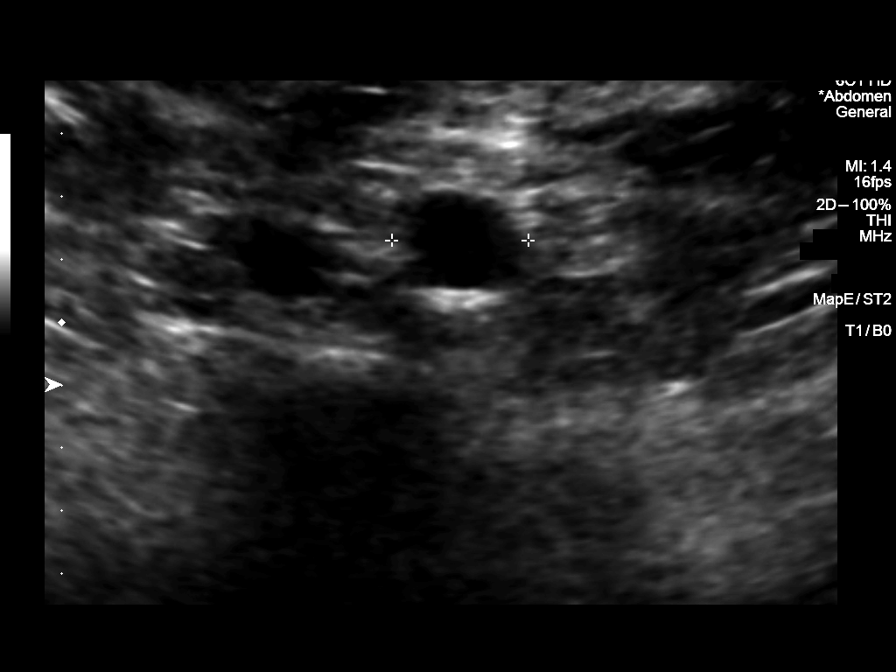
[im 6/8]
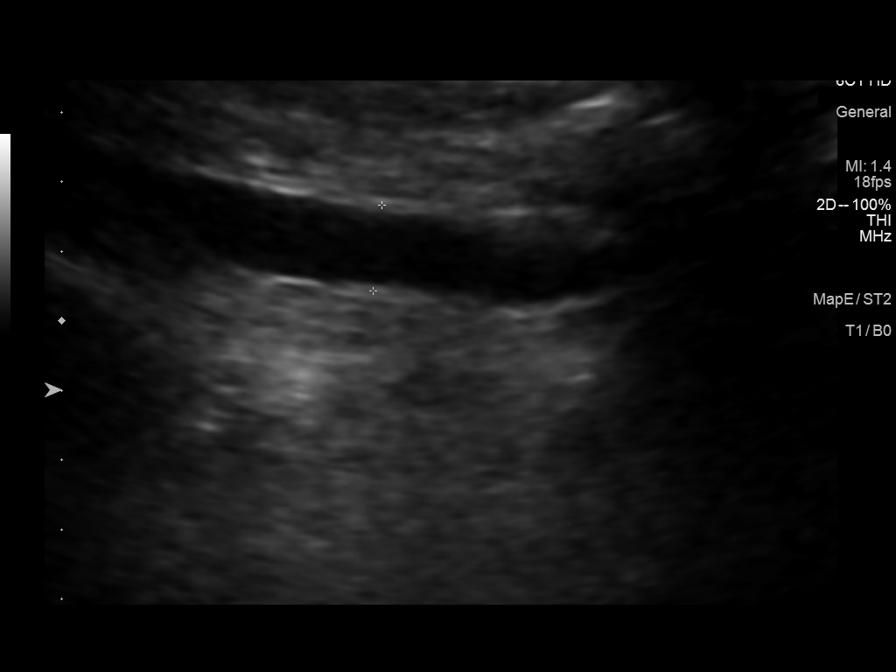
[im 7/8]
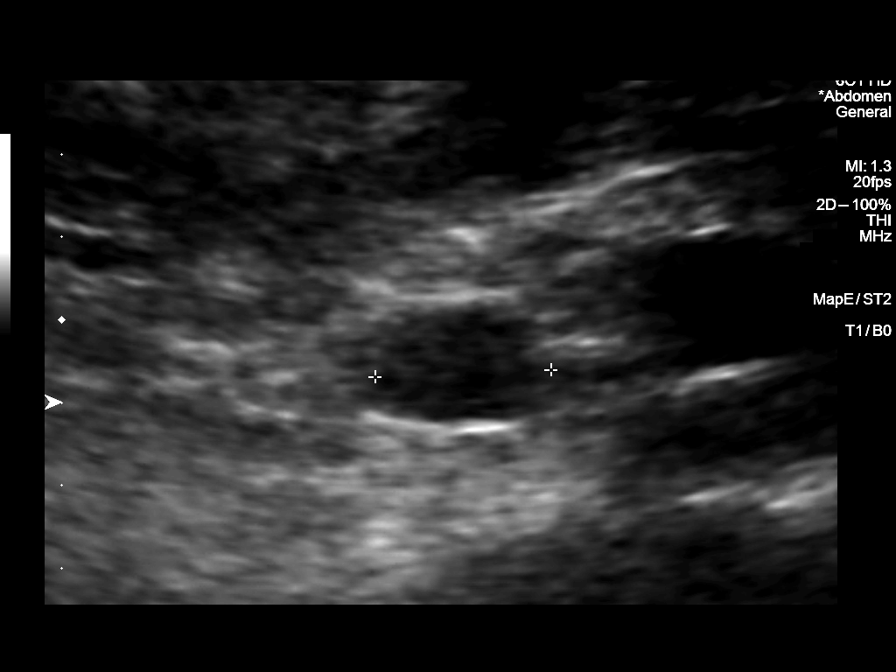
[im 8/8]
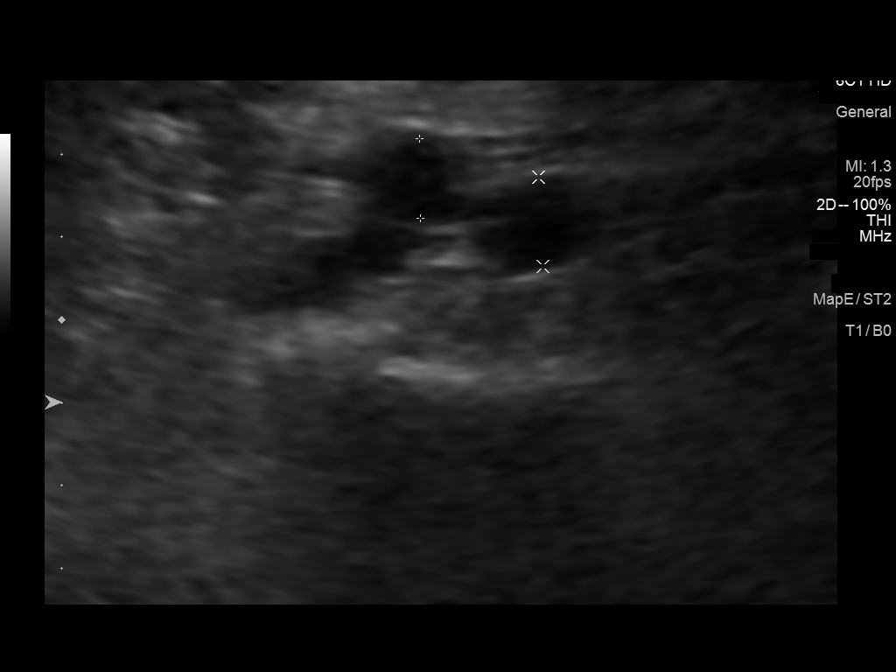

[8 of 8 positions shown; findings below may reference images not displayed]

FINDINGS: Abdominal aortic measurements as follows:

Proximal:  2.6 cm

Mid:  2.2 cm

Distal:  2.1 cm
IMPRESSION: Maximal diameter of the abdominal aorta is 2.6 cm. Ectatic abdominal
aorta at risk for aneurysm development. Recommend followup by
ultrasound in 5 years. This recommendation follows ACR consensus
guidelines: White Paper of the ACR Incidental Findings Committee II

## 2020-09-26 ENCOUNTER — Other Ambulatory Visit: Payer: Self-pay | Admitting: Internal Medicine

## 2020-09-26 DIAGNOSIS — E7800 Pure hypercholesterolemia, unspecified: Secondary | ICD-10-CM

## 2020-10-02 DIAGNOSIS — N179 Acute kidney failure, unspecified: Secondary | ICD-10-CM | POA: Diagnosis not present

## 2020-10-02 DIAGNOSIS — I10 Essential (primary) hypertension: Secondary | ICD-10-CM | POA: Diagnosis not present

## 2020-10-02 DIAGNOSIS — E785 Hyperlipidemia, unspecified: Secondary | ICD-10-CM | POA: Diagnosis not present

## 2020-10-02 DIAGNOSIS — I480 Paroxysmal atrial fibrillation: Secondary | ICD-10-CM | POA: Diagnosis not present

## 2020-10-11 ENCOUNTER — Other Ambulatory Visit: Payer: Self-pay | Admitting: Internal Medicine

## 2020-10-13 NOTE — Telephone Encounter (Signed)
11m, 102.5kg, scr 1.12 10/15/19, lovw/hilty 10/17/19, ccr 58mlmin

## 2020-12-10 ENCOUNTER — Other Ambulatory Visit: Payer: Self-pay | Admitting: Internal Medicine

## 2020-12-10 DIAGNOSIS — E7800 Pure hypercholesterolemia, unspecified: Secondary | ICD-10-CM

## 2021-01-19 ENCOUNTER — Other Ambulatory Visit: Payer: Self-pay | Admitting: Internal Medicine

## 2021-01-19 ENCOUNTER — Telehealth: Payer: Self-pay | Admitting: Internal Medicine

## 2021-01-19 DIAGNOSIS — E7800 Pure hypercholesterolemia, unspecified: Secondary | ICD-10-CM

## 2021-01-19 NOTE — Telephone Encounter (Signed)
Patients Atorvastatin 40 mg and Cardizem 300 mg was sent to [facility] with 30 day supply. Patient must schedule yearly office visit and it was stated in the patient signature section of pill bottle.

## 2021-01-19 NOTE — Telephone Encounter (Signed)
*  STAT* If patient is at the pharmacy, call can be transferred to refill team.   1. Which medications need to be refilled? (please list name of each medication and dose if known) atorvastatin (LIPITOR) 40 MG tablet diltiazem (CARDIZEM LA) 300 MG 24 hr tablet  2. Which pharmacy/location (including street and city if local pharmacy) is medication to be sent to? [facility]  ([place], [place]  3. Do they need a 30 day or 90 day supply? [place]

## 2021-01-22 MED ORDER — HYDROCHLOROTHIAZIDE 25 MG PO TABS: 25.0000 mg | ORAL_TABLET | Freq: Every day | ORAL | 0 refills | Status: DC

## 2021-01-22 MED ORDER — ATORVASTATIN CALCIUM 40 MG PO TABS: 40.0000 mg | ORAL_TABLET | Freq: Every day | ORAL | 0 refills | Status: DC

## 2021-01-22 MED ORDER — DILTIAZEM HCL ER COATED BEADS 300 MG PO TB24: 300.0000 mg | ORAL_TABLET | Freq: Every day | ORAL | 0 refills | Status: DC

## 2021-01-22 NOTE — Addendum Note (Signed)
Addended by: Vennie Homans on: 01/22/2021 12:06 PM   Modules accepted: Orders

## 2021-01-22 NOTE — Telephone Encounter (Signed)
   Pt is calling back, he said he [facility] saying they did not receive refill. Pt is scheduled for an appt with PA on 08/31 needs enough meds until appt. Please send 90 days supply

## 2021-01-22 NOTE — Addendum Note (Signed)
Addended by: Vennie Homans on: 01/22/2021 04:25 PM   Modules accepted: Orders

## 2021-01-22 NOTE — Telephone Encounter (Signed)
Rx(s) sent to pharmacy electronically.  

## 2021-01-22 NOTE — Telephone Encounter (Signed)
Pt called in to fu on the diltiazem (CARDIZEM LA) 300 MG 24 hr tablet  He stated that [facility] and [facility] do not have this med to refill ? He Rec'd the other refills    Best number [phone]

## 2021-01-28 ENCOUNTER — Other Ambulatory Visit: Payer: Self-pay | Admitting: Internal Medicine

## 2021-01-28 NOTE — Telephone Encounter (Signed)
Rx(s) sent to pharmacy electronically.  

## 2021-03-25 ENCOUNTER — Encounter: Payer: Self-pay | Admitting: Medical

## 2021-03-25 ENCOUNTER — Ambulatory Visit: Payer: Medicare Other | Admitting: Medical

## 2021-03-25 ENCOUNTER — Other Ambulatory Visit: Payer: Self-pay

## 2021-03-25 ENCOUNTER — Other Ambulatory Visit: Payer: Self-pay | Admitting: Internal Medicine

## 2021-03-25 VITALS — BP 138/78 | HR 95 | Ht 71.0 in | Wt 224.8 lb

## 2021-03-25 DIAGNOSIS — I10 Essential (primary) hypertension: Secondary | ICD-10-CM

## 2021-03-25 DIAGNOSIS — F1010 Alcohol abuse, uncomplicated: Secondary | ICD-10-CM | POA: Diagnosis not present

## 2021-03-25 DIAGNOSIS — I4821 Permanent atrial fibrillation: Secondary | ICD-10-CM

## 2021-03-25 DIAGNOSIS — E7800 Pure hypercholesterolemia, unspecified: Secondary | ICD-10-CM

## 2021-03-25 MED ORDER — AMLODIPINE BESYLATE 5 MG PO TABS: 5.0000 mg | ORAL_TABLET | Freq: Every day | ORAL | 4 refills | Status: DC

## 2021-03-25 MED ORDER — AMLODIPINE BESYLATE 5 MG PO TABS: 5.0000 mg | ORAL_TABLET | Freq: Every day | ORAL | 11 refills | Status: DC

## 2021-03-25 NOTE — Patient Instructions (Signed)
Medication Instructions:  START AMLODIPIE '5MG'$  DIALY  *If you need a refill on your cardiac medications before your next appointment, please call your pharmacy*  Lab Work:   Testing/Procedures:  NONE    NONE  Special Instructions PLEASE READ AND FOLLOW SALTY 6-ATTACHED-1,'800mg'$  daily  PLEASE INCREASE PHYSICAL ACTIVITY AS TOLERATED,   Follow-Up: Your next appointment:  12 month(s) In Person with You may see Pixie Casino, MD Roby Lofts, PA-C  or one of the following Advanced Practice Providers on your designated Care Team:  Almyra Deforest, [place] Fabian Sharp, PA-C   Please call our office 2 months in advance to schedule this appointment   At [facility], you and your health needs are our priority.  As part of our continuing mission to provide you with exceptional heart care, we have created designated Provider Care Teams.  These Care Teams include your primary Cardiologist (physician) and Advanced Practice Providers (APPs -  Physician Assistants and Nurse Practitioners) who all work together to provide you with the care you need, when you need it.            6 SALTY THINGS TO AVOID     1,'800MG'$  DAILY
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
## 2021-03-31 ENCOUNTER — Other Ambulatory Visit: Payer: Self-pay | Admitting: Internal Medicine

## 2021-04-01 ENCOUNTER — Other Ambulatory Visit: Payer: Self-pay | Admitting: Internal Medicine

## 2021-04-01 NOTE — Telephone Encounter (Signed)
Prescription refill request for Xarelto received.  Indication:afib Last office visit:k[place] 03/25/21 Weight:102kg Age:16mScr:0.940 mg/ 08/06/2020 CrCl: 105.5

## 2021-04-27 MED ORDER — CARVEDILOL 3.125 MG PO TABS: 3.1250 mg | ORAL_TABLET | Freq: Two times a day (BID) | ORAL | 6 refills | Status: DC

## 2021-04-27 NOTE — Telephone Encounter (Signed)
Called pt and notified to change to carvedilol 3.125mg  BID. Informed pt to take BP 1 hour after taking the medication and call if BP>130/80. Pt is out of town and would like medication sent to [facility] [place]. New rx sent to requested pharmacy. Pt states that he will start medication today but cannot take BP until he is back home on Thursday.

## 2021-08-05 DIAGNOSIS — I10 Essential (primary) hypertension: Secondary | ICD-10-CM | POA: Diagnosis not present

## 2021-08-05 DIAGNOSIS — R7303 Prediabetes: Secondary | ICD-10-CM | POA: Diagnosis not present

## 2021-08-11 DIAGNOSIS — Z0000 Encounter for general adult medical examination without abnormal findings: Secondary | ICD-10-CM | POA: Diagnosis not present

## 2021-08-21 DIAGNOSIS — I480 Paroxysmal atrial fibrillation: Secondary | ICD-10-CM | POA: Diagnosis not present

## 2021-08-21 DIAGNOSIS — N179 Acute kidney failure, unspecified: Secondary | ICD-10-CM | POA: Diagnosis not present

## 2021-08-21 DIAGNOSIS — E785 Hyperlipidemia, unspecified: Secondary | ICD-10-CM | POA: Diagnosis not present

## 2021-08-21 DIAGNOSIS — I10 Essential (primary) hypertension: Secondary | ICD-10-CM | POA: Diagnosis not present

## 2021-09-24 DIAGNOSIS — I4819 Other persistent atrial fibrillation: Secondary | ICD-10-CM | POA: Diagnosis not present

## 2021-09-24 DIAGNOSIS — I10 Essential (primary) hypertension: Secondary | ICD-10-CM | POA: Diagnosis not present

## 2021-09-24 DIAGNOSIS — E785 Hyperlipidemia, unspecified: Secondary | ICD-10-CM | POA: Diagnosis not present

## 2021-09-24 DIAGNOSIS — D6869 Other thrombophilia: Secondary | ICD-10-CM | POA: Diagnosis not present

## 2021-09-24 DIAGNOSIS — R059 Cough, unspecified: Secondary | ICD-10-CM | POA: Diagnosis not present

## 2021-10-12 DIAGNOSIS — H26491 Other secondary cataract, right eye: Secondary | ICD-10-CM | POA: Diagnosis not present

## 2021-10-16 ENCOUNTER — Other Ambulatory Visit: Payer: Self-pay | Admitting: Medical

## 2021-10-16 NOTE — Telephone Encounter (Signed)
Prescription refill request for Xarelto received.  ?Indication:Afib ?Last office visit:8/22 ?Weight:102 kg ?Age:71 ?Scr:0.9 ?CrCl:110.19 ml/min ? ?Prescription refilled ? ?

## 2021-10-19 DIAGNOSIS — H35341 Macular cyst, hole, or pseudohole, right eye: Secondary | ICD-10-CM | POA: Diagnosis not present

## 2021-10-19 DIAGNOSIS — H25812 Combined forms of age-related cataract, left eye: Secondary | ICD-10-CM | POA: Diagnosis not present

## 2021-10-27 DIAGNOSIS — H35341 Macular cyst, hole, or pseudohole, right eye: Secondary | ICD-10-CM | POA: Diagnosis not present

## 2021-10-27 DIAGNOSIS — H33011 Retinal detachment with single break, right eye: Secondary | ICD-10-CM | POA: Diagnosis not present

## 2021-12-07 DIAGNOSIS — H35341 Macular cyst, hole, or pseudohole, right eye: Secondary | ICD-10-CM | POA: Diagnosis not present

## 2021-12-16 IMAGING — US US ABDOMEN COMPLETE
1 series · 14 of 25 positions shown · non-contrast
Comparison: None

CLINICAL DATA: Elevated LFTs.  Acute kidney injury.

EXAM:
ABDOMEN ULTRASOUND COMPLETE

[Series 1: us abdomen complete · 14 of 153 slices shown]
[im 1/153]
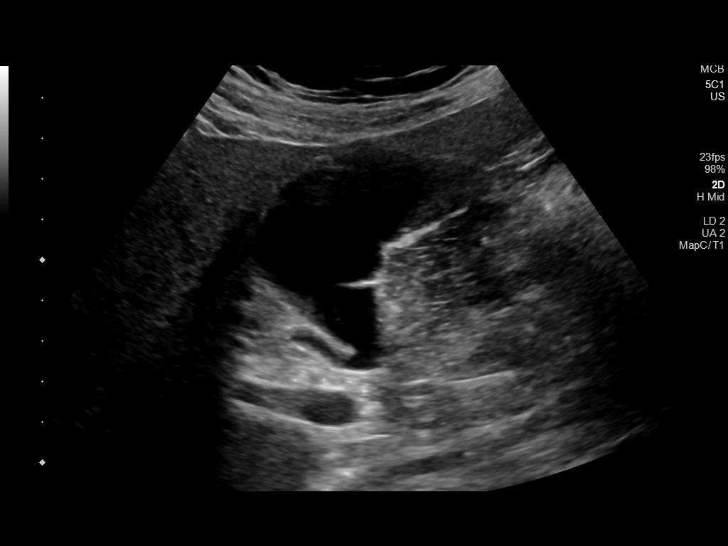
[im 13/153]
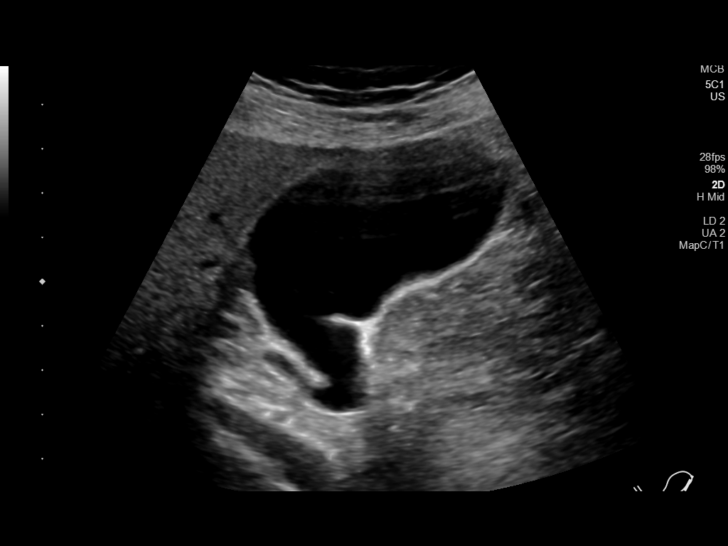
[im 26/153]
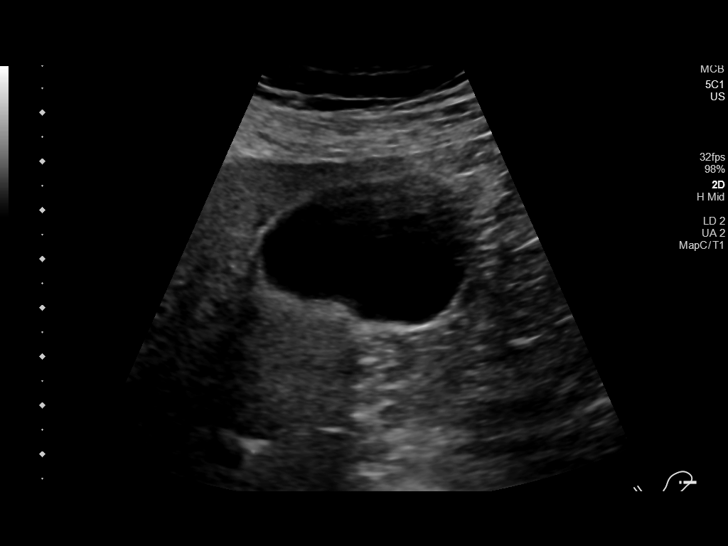
[im 39/153]
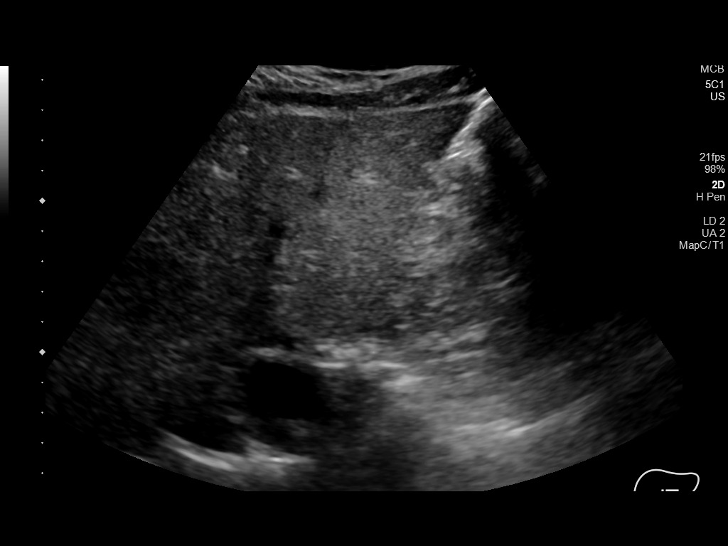
[im 51/153]
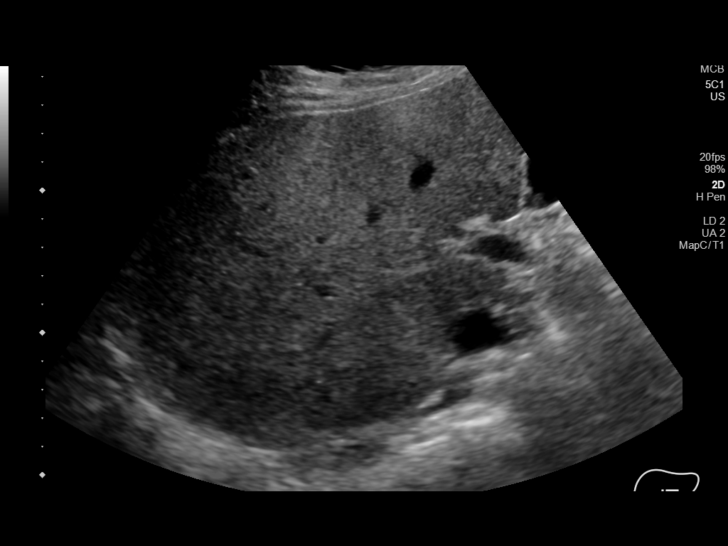
[im 58/153]
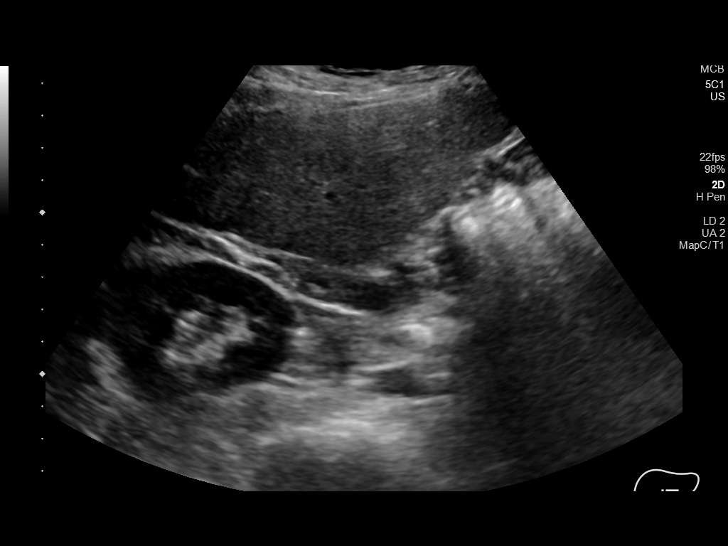
[im 70/153]
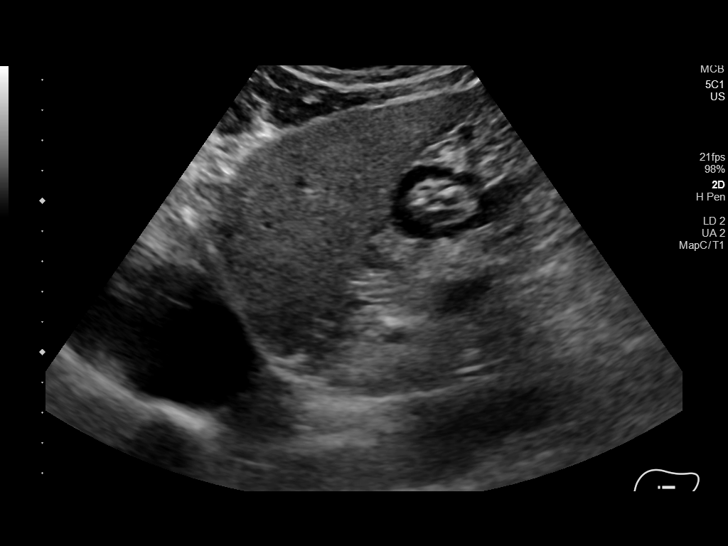
[im 83/153]
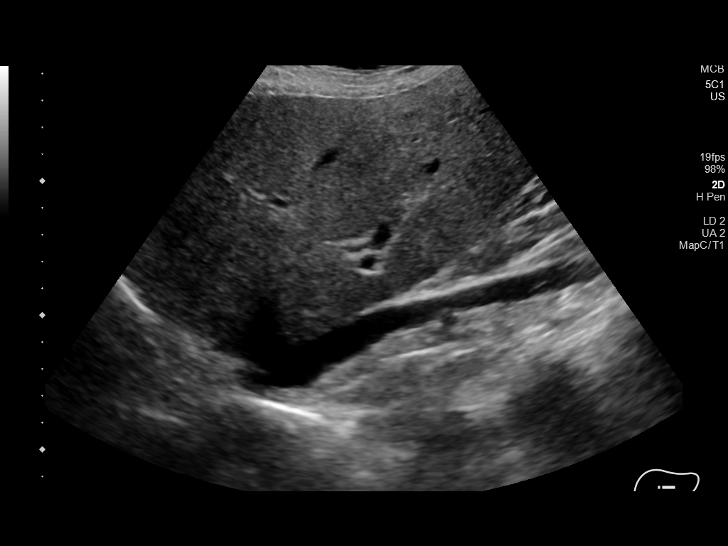
[im 96/153]
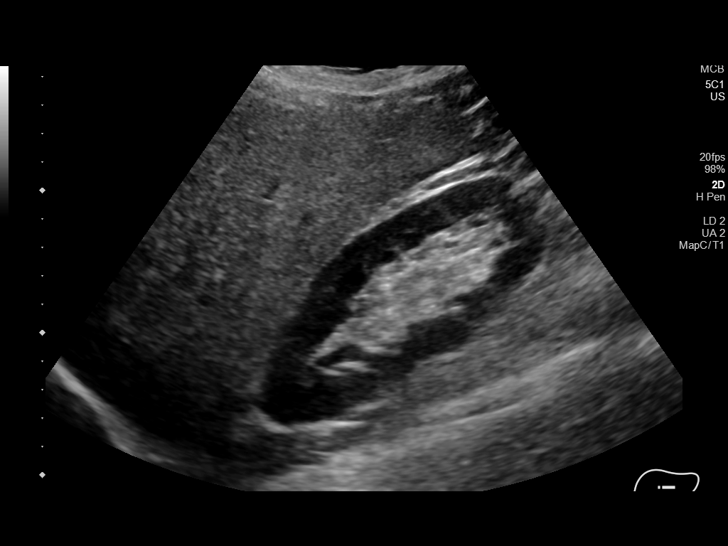
[im 102/153]
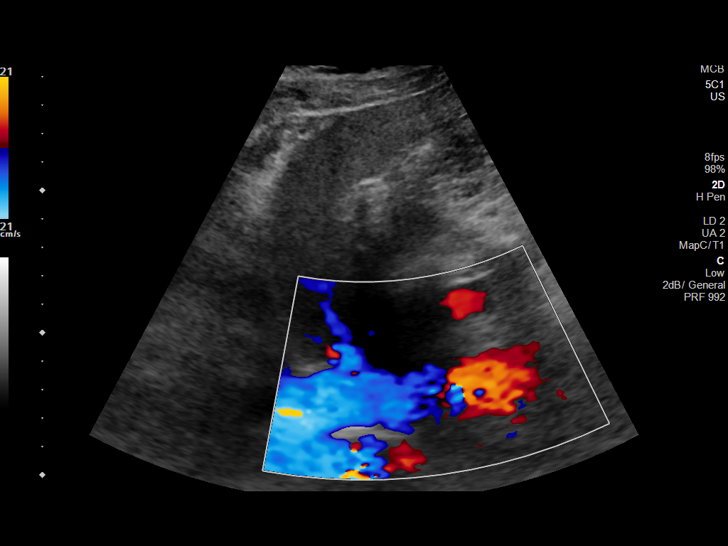
[im 115/153]
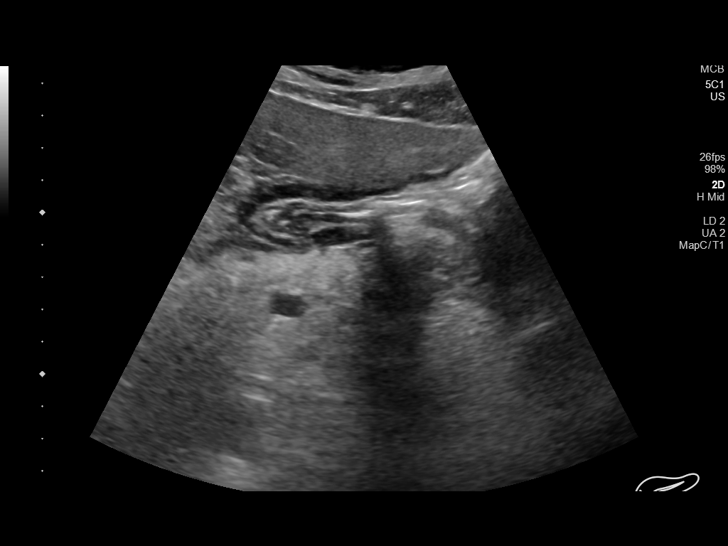
[im 127/153]
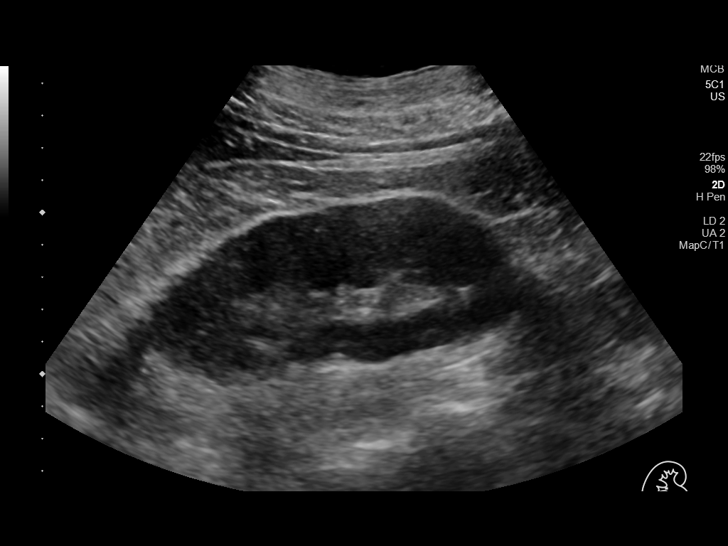
[im 140/153]
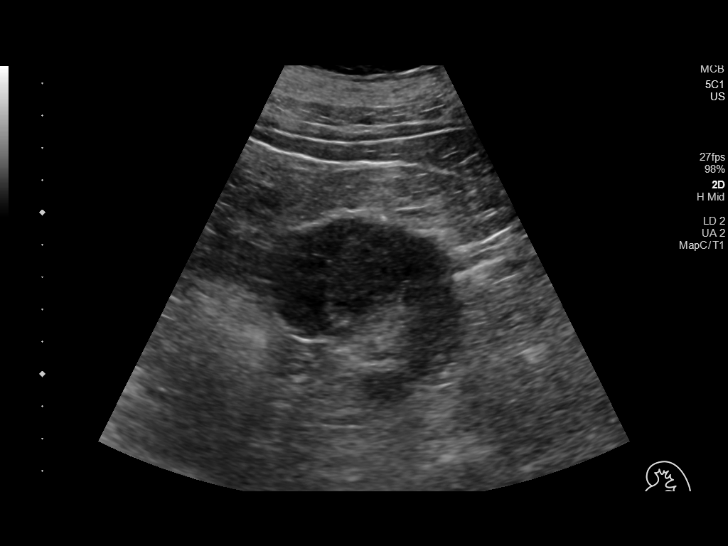
[im 153/153]
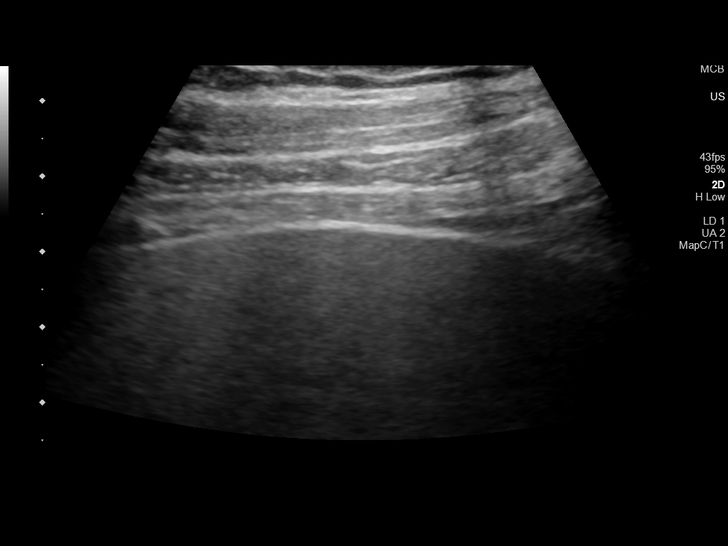

[14 of 25 positions shown; findings below may reference images not displayed]

FINDINGS: Gallbladder: No gallstones or wall thickening visualized. No
sonographic Murphy sign noted by sonographer.

Common bile duct: Diameter: Normal caliber, 3 mm

Liver: Heterogeneous, increased echotexture throughout the liver
which appears prominent. No focal hepatic abnormality. Portal vein
is patent on color Doppler imaging with normal direction of blood
flow towards the liver.

IVC: No abnormality visualized.

Pancreas: Visualized portion unremarkable.

Spleen: Size and appearance within normal limits.

Right Kidney: Length: 13.1 cm. Echogenicity within normal limits. No
mass or hydronephrosis visualized.

Left Kidney: Length: 12.1 cm. Echogenicity within normal limits. No
mass or hydronephrosis visualized.

Abdominal aorta: No aneurysm visualized.

Other findings: None.
IMPRESSION: Prominent appearing liver with increased echotexture compatible with
fatty infiltration.

## 2021-12-25 ENCOUNTER — Other Ambulatory Visit: Payer: Self-pay | Admitting: Internal Medicine

## 2021-12-25 DIAGNOSIS — E7800 Pure hypercholesterolemia, unspecified: Secondary | ICD-10-CM

## 2022-01-14 IMAGING — CT CT ABDOMEN W/ CM
2 of 3 series · 13 of 32 positions shown, 19 images · IV contrast (APPLIED)
Comparison: 10/10/2019 abdominal ultrasound.

CLINICAL DATA: Persistently elevated lipase on blood work. Evaluate
pancreas.

EXAM:
CT ABDOMEN WITH CONTRAST
TECHNIQUE: Multidetector CT imaging of the abdomen was performed using the
standard protocol following bolus administration of intravenous
contrast.
CONTRAST:  100mL 4CGKI4-YRR IOPAMIDOL (4CGKI4-YRR) INJECTION 61%

[Series 2: abd w/ contrast · axial · 0.91mm/px · z∈[+859,+1089]mm · 11 of 56 slices shown, 17 images]
[im 5/56  soft-tissue]
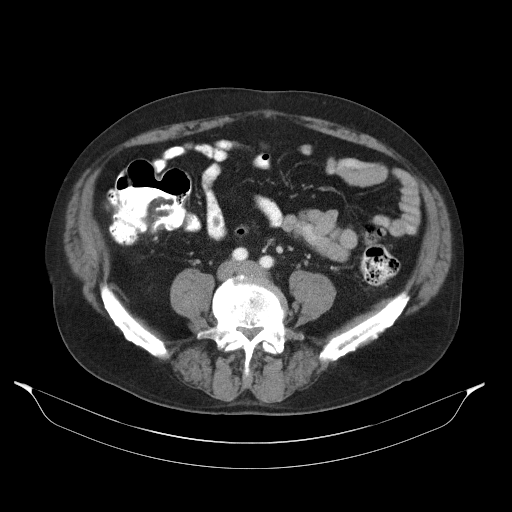
[im 5/56  bone]
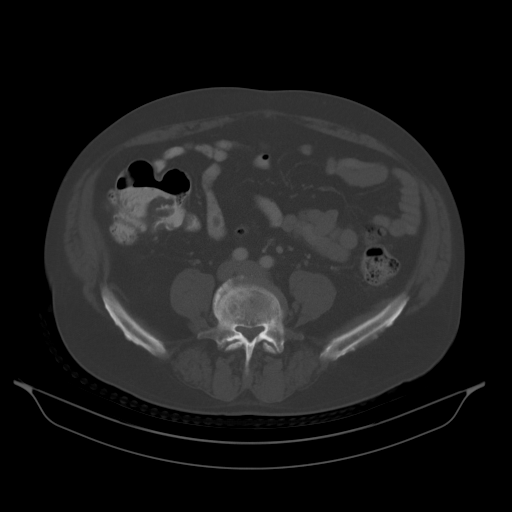
[im 10/56  soft-tissue]
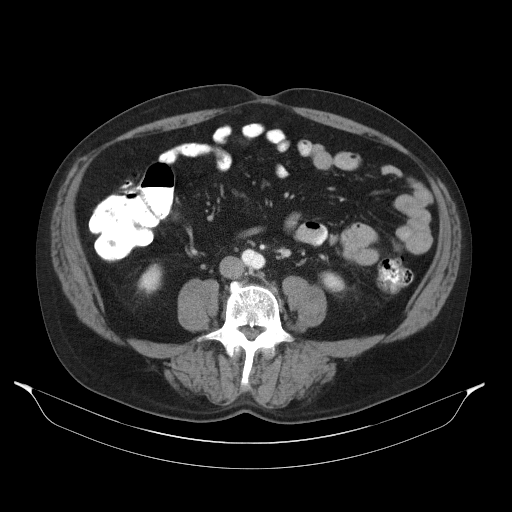
[im 14/56  soft-tissue]
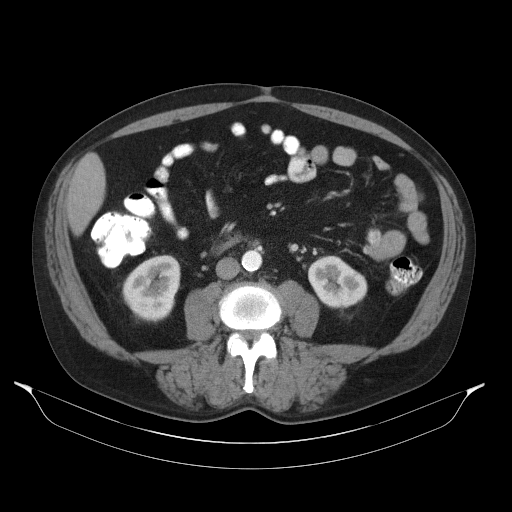
[im 19/56  soft-tissue]
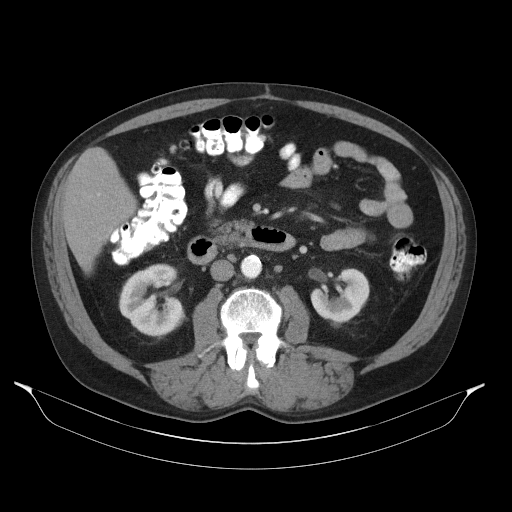
[im 23/56  soft-tissue]
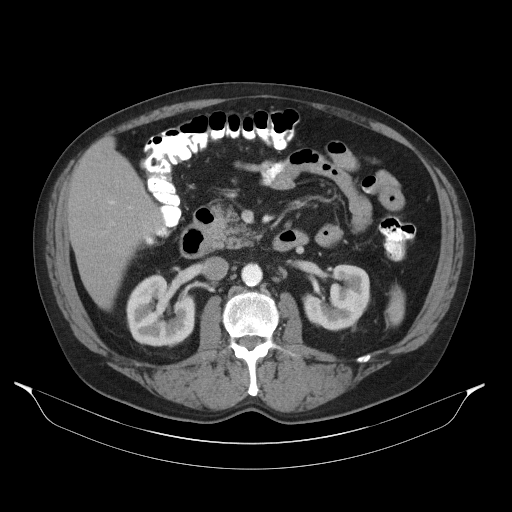
[im 28/56  soft-tissue]
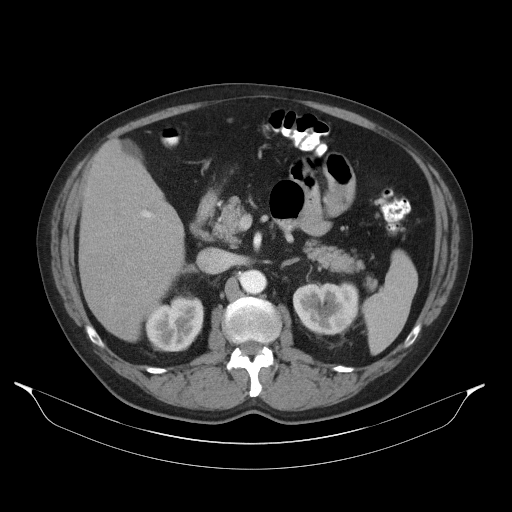
[im 33/56  soft-tissue]
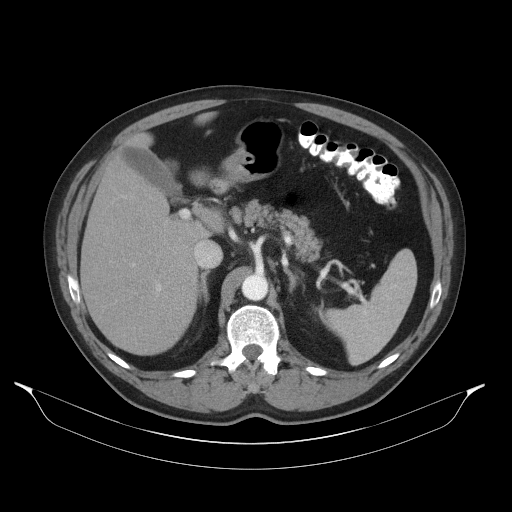
[im 37/56  soft-tissue]
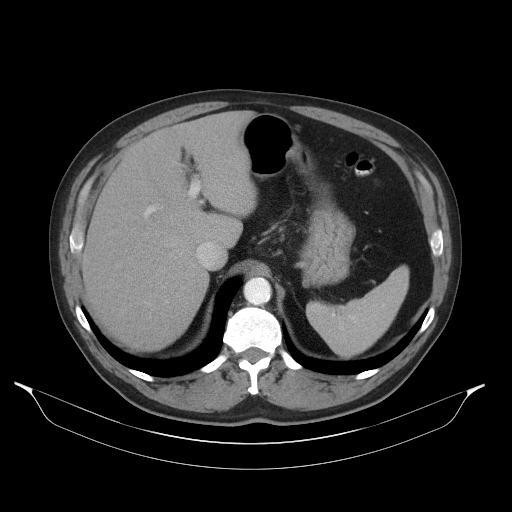
[im 37/56  lung]
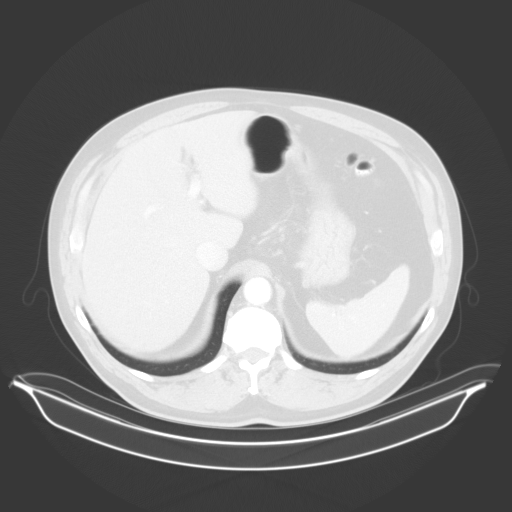
[im 42/56  soft-tissue]
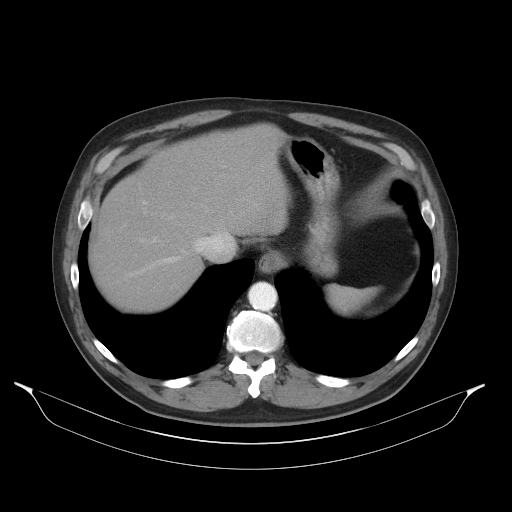
[im 42/56  lung]
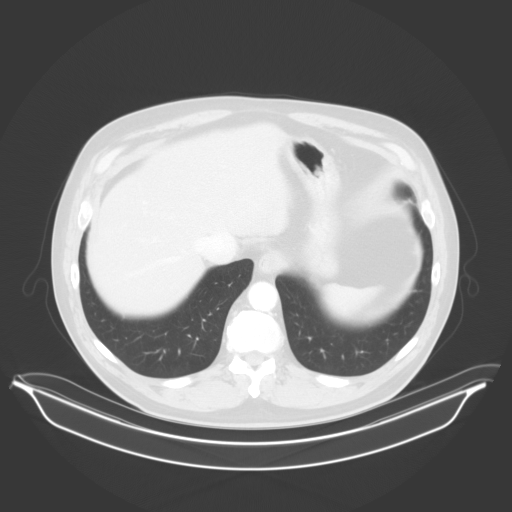
[im 42/56  bone]
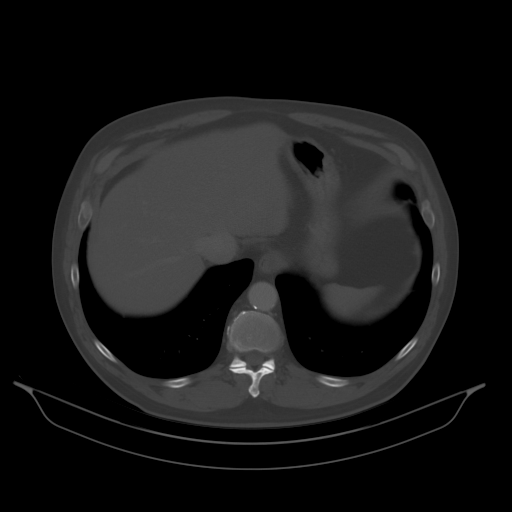
[im 46/56  soft-tissue]
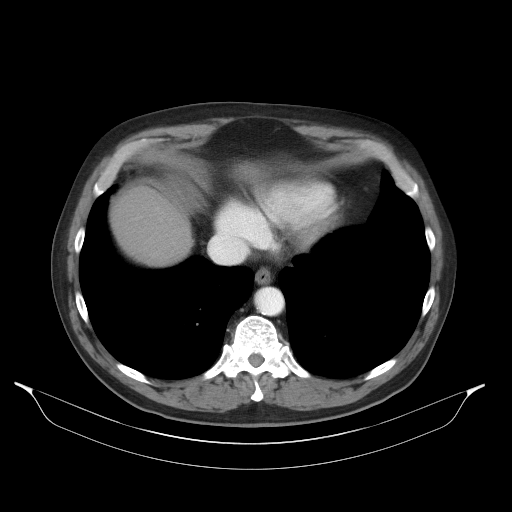
[im 46/56  lung]
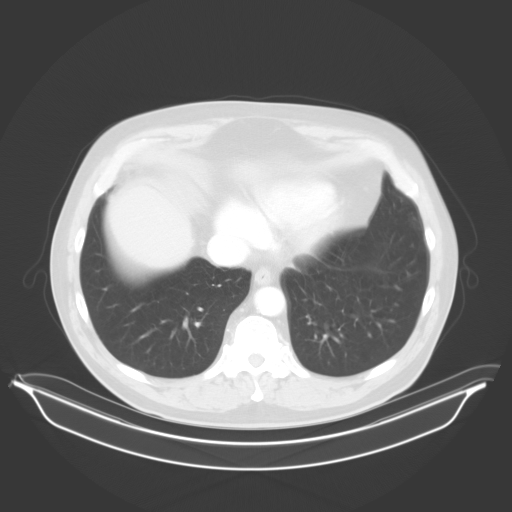
[im 51/56  soft-tissue]
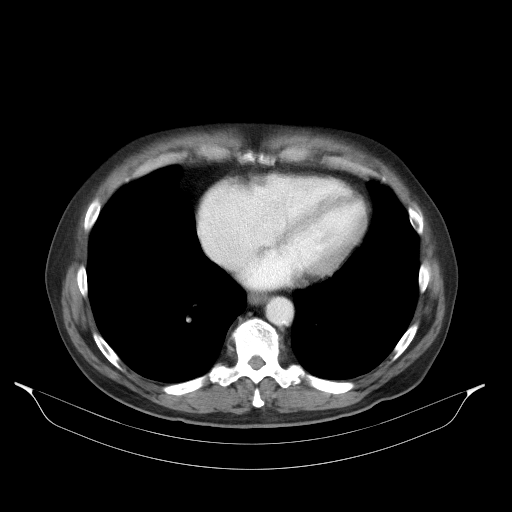
[im 51/56  lung]
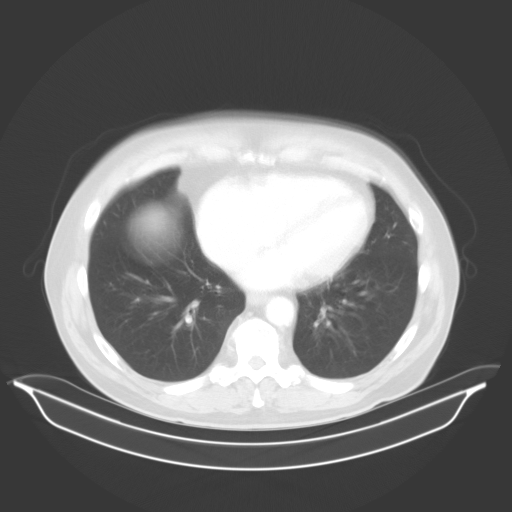

[Series 6: lung · axial · 0.91mm/px · z∈[+1014,+1024]mm · 2 of 56 slices shown]
[im 6/56  bone]
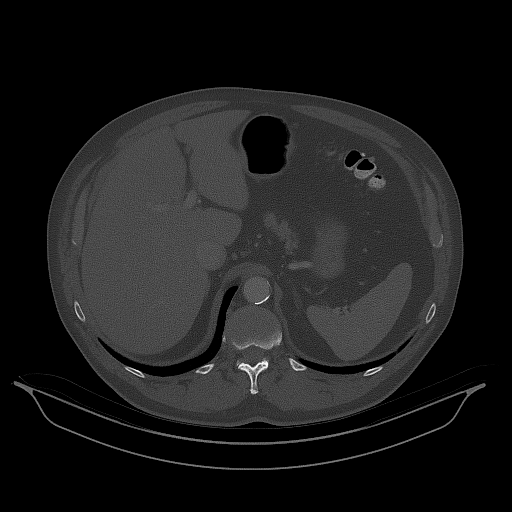
[im 11/56  bone]
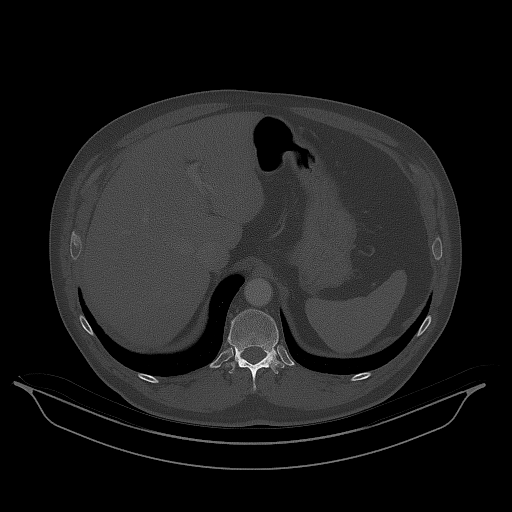

[13 of 32 positions shown; findings below may reference images not displayed]

FINDINGS: Lower chest: Motion degradation at the lung bases. Normal heart size
without pericardial or pleural effusion.

Hepatobiliary: Normal liver. Normal gallbladder, without biliary
ductal dilatation.

Pancreas: No evidence of pancreatic or peripancreatic edema. No
dominant mass or pancreatic duct dilatation. No pancreas divisum.

Spleen: Normal in size, without focal abnormality.

Adrenals/Urinary Tract: Normal adrenal glands. Normal kidneys,
without hydronephrosis.

Stomach/Bowel: Normal stomach, without wall thickening. Transverse
duodenal diverticulum. Otherwise normal small bowel. Scattered
colonic diverticula. Normal visualized abdominal portions of the
terminal ileum and appendix.

Vascular/Lymphatic: Aortic atherosclerosis. No retroperitoneal or
retrocrural adenopathy.

Other: No ascites.

Musculoskeletal: Degenerative disc disease at L4-5.
IMPRESSION: No acute process in the abdomen. No evidence of pancreatitis or
explanation for elevated lipase level.

Aortic Atherosclerosis ([id number]-DFX.X).

## 2022-01-18 DIAGNOSIS — H35341 Macular cyst, hole, or pseudohole, right eye: Secondary | ICD-10-CM | POA: Diagnosis not present

## 2022-04-21 DIAGNOSIS — M7542 Impingement syndrome of left shoulder: Secondary | ICD-10-CM | POA: Diagnosis not present

## 2022-04-21 DIAGNOSIS — M25512 Pain in left shoulder: Secondary | ICD-10-CM | POA: Diagnosis not present

## 2022-04-21 DIAGNOSIS — M47812 Spondylosis without myelopathy or radiculopathy, cervical region: Secondary | ICD-10-CM | POA: Diagnosis not present

## 2022-04-21 DIAGNOSIS — M542 Cervicalgia: Secondary | ICD-10-CM | POA: Diagnosis not present

## 2022-05-04 DIAGNOSIS — L821 Other seborrheic keratosis: Secondary | ICD-10-CM | POA: Diagnosis not present

## 2022-05-04 DIAGNOSIS — D2262 Melanocytic nevi of left upper limb, including shoulder: Secondary | ICD-10-CM | POA: Diagnosis not present

## 2022-05-04 DIAGNOSIS — D1801 Hemangioma of skin and subcutaneous tissue: Secondary | ICD-10-CM | POA: Diagnosis not present

## 2022-05-04 DIAGNOSIS — D2261 Melanocytic nevi of right upper limb, including shoulder: Secondary | ICD-10-CM | POA: Diagnosis not present

## 2022-05-04 DIAGNOSIS — C4441 Basal cell carcinoma of skin of scalp and neck: Secondary | ICD-10-CM | POA: Diagnosis not present

## 2022-05-04 DIAGNOSIS — D485 Neoplasm of uncertain behavior of skin: Secondary | ICD-10-CM | POA: Diagnosis not present

## 2022-05-05 DIAGNOSIS — M5412 Radiculopathy, cervical region: Secondary | ICD-10-CM | POA: Diagnosis not present

## 2022-05-12 DIAGNOSIS — M5412 Radiculopathy, cervical region: Secondary | ICD-10-CM | POA: Diagnosis not present

## 2022-05-24 DIAGNOSIS — H35341 Macular cyst, hole, or pseudohole, right eye: Secondary | ICD-10-CM | POA: Diagnosis not present

## 2022-05-24 DIAGNOSIS — H25812 Combined forms of age-related cataract, left eye: Secondary | ICD-10-CM | POA: Diagnosis not present

## 2022-05-24 DIAGNOSIS — H35371 Puckering of macula, right eye: Secondary | ICD-10-CM | POA: Diagnosis not present

## 2022-05-25 DIAGNOSIS — M5412 Radiculopathy, cervical region: Secondary | ICD-10-CM | POA: Diagnosis not present

## 2022-05-25 DIAGNOSIS — H35341 Macular cyst, hole, or pseudohole, right eye: Secondary | ICD-10-CM | POA: Diagnosis not present

## 2022-06-01 DIAGNOSIS — M5412 Radiculopathy, cervical region: Secondary | ICD-10-CM | POA: Diagnosis not present

## 2022-06-02 DIAGNOSIS — M5412 Radiculopathy, cervical region: Secondary | ICD-10-CM | POA: Diagnosis not present

## 2022-06-02 DIAGNOSIS — M25512 Pain in left shoulder: Secondary | ICD-10-CM | POA: Diagnosis not present

## 2022-06-08 DIAGNOSIS — M5412 Radiculopathy, cervical region: Secondary | ICD-10-CM | POA: Diagnosis not present

## 2022-06-15 DIAGNOSIS — M5412 Radiculopathy, cervical region: Secondary | ICD-10-CM | POA: Diagnosis not present

## 2022-06-22 DIAGNOSIS — M5412 Radiculopathy, cervical region: Secondary | ICD-10-CM | POA: Diagnosis not present

## 2022-07-09 ENCOUNTER — Other Ambulatory Visit: Payer: Self-pay | Admitting: Medical

## 2022-07-09 NOTE — Telephone Encounter (Signed)
Prescription refill request for Xarelto received.  Indication:afib Last office visit:needs appt Weight:102 kg Age:71 Scr:0.9 CrCl:108.61  ml/min  Prescription refilled

## 2022-08-06 ENCOUNTER — Ambulatory Visit: Payer: Medicare Other | Admitting: Adult Health
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
## 2022-08-31 ENCOUNTER — Ambulatory Visit: Payer: Medicare Other | Admitting: Physician Assistant

## 2022-09-28 ENCOUNTER — Ambulatory Visit: Payer: Medicare Other | Attending: Adult Health | Admitting: Physician Assistant

## 2022-09-28 ENCOUNTER — Encounter: Payer: Self-pay | Admitting: Physician Assistant

## 2022-09-28 VITALS — BP 132/82 | HR 88 | Ht 71.0 in | Wt 229.0 lb

## 2022-09-28 DIAGNOSIS — I4821 Permanent atrial fibrillation: Secondary | ICD-10-CM | POA: Diagnosis not present

## 2022-09-28 DIAGNOSIS — I10 Essential (primary) hypertension: Secondary | ICD-10-CM

## 2022-09-28 MED ORDER — DILTIAZEM HCL ER COATED BEADS 360 MG PO CP24: 360.0000 mg | ORAL_CAPSULE | Freq: Every day | ORAL | 3 refills | Status: DC

## 2022-09-28 MED ORDER — CHLORTHALIDONE 25 MG PO TABS: 25.0000 mg | ORAL_TABLET | Freq: Every day | ORAL | 3 refills | Status: DC
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
## 2022-09-28 NOTE — Patient Instructions (Signed)
Medication Instructions:  Stop HCTZ as directed Stop Coreg as directed Start Chlorthalidone 25 mg daily Increase Cardizem CD 360 mg daily  *If you need a refill on your cardiac medications before your next appointment, please call your pharmacy*   Lab Work: NONE ordered at this time of appointment   If you have labs (blood work) drawn today and your tests are completely normal, you will receive your results only by: [place] (if you have MyChart) OR A paper copy in the mail If you have any lab test that is abnormal or we need to change your treatment, we will call you to review the results.   Testing/Procedures: NONE ordered at this time of appointment     Follow-Up: At [facility], you and your health needs are our priority.  As part of our continuing mission to provide you with exceptional heart care, we have created designated Provider Care Teams.  These Care Teams include your primary Cardiologist (physician) and Advanced Practice Providers (APPs -  Physician Assistants and Nurse Practitioners) who all work together to provide you with the care you need, when you need it.  We recommend signing up for the patient portal called "MyChart".  Sign up information is provided on this After Visit Summary.  MyChart is used to connect with patients for Virtual Visits (Telemedicine).  Patients are able to view lab/test results, encounter notes, upcoming appointments, etc.  Non-urgent messages can be sent to your provider as well.   To learn more about what you can do with MyChart, go to [email or web address].    Your next appointment:   1 year(s) ([place]) 6 weeks (Pharm D)  Provider:   Pixie Casino, MD     Other Instructions Referral sent for HTN Pharm D clinic (6 weeks apt needed)

## 2022-09-30 ENCOUNTER — Encounter: Payer: Self-pay | Admitting: Physician Assistant

## 2022-10-20 ENCOUNTER — Other Ambulatory Visit: Payer: Self-pay | Admitting: Internal Medicine

## 2022-10-20 DIAGNOSIS — E7800 Pure hypercholesterolemia, unspecified: Secondary | ICD-10-CM

## 2022-10-20 NOTE — Telephone Encounter (Addendum)
Prescription refill request for Xarelto received.  Indication: Afib  Last office visit: 09/28/22 Thomas Patterson)  Weight: 103.9kg Age: 72 Scr: 0.99 (08/05/21)  CrCl:   Labs overdue. Called PCP and confirmed labs have not been drawn within the past year.   Called pt to make him aware of overdue labs. Pt states he has labs scheduled to be drawn in 2 weeks at his PCP office and does not need a refill as this time. Refill request refused and made pt aware he should contact his pharmacy when he needs a refill.

## 2022-10-27 DIAGNOSIS — Z8739 Personal history of other diseases of the musculoskeletal system and connective tissue: Secondary | ICD-10-CM | POA: Diagnosis not present

## 2022-10-27 DIAGNOSIS — E785 Hyperlipidemia, unspecified: Secondary | ICD-10-CM | POA: Diagnosis not present

## 2022-10-27 DIAGNOSIS — Z0000 Encounter for general adult medical examination without abnormal findings: Secondary | ICD-10-CM | POA: Diagnosis not present

## 2022-10-27 DIAGNOSIS — D6869 Other thrombophilia: Secondary | ICD-10-CM | POA: Diagnosis not present

## 2022-10-27 DIAGNOSIS — I10 Essential (primary) hypertension: Secondary | ICD-10-CM | POA: Diagnosis not present

## 2022-10-27 DIAGNOSIS — I700 Atherosclerosis of aorta: Secondary | ICD-10-CM | POA: Diagnosis not present

## 2022-10-27 DIAGNOSIS — I77811 Abdominal aortic ectasia: Secondary | ICD-10-CM | POA: Diagnosis not present

## 2022-10-27 DIAGNOSIS — I4811 Longstanding persistent atrial fibrillation: Secondary | ICD-10-CM | POA: Diagnosis not present

## 2022-10-27 DIAGNOSIS — R7303 Prediabetes: Secondary | ICD-10-CM | POA: Diagnosis not present

## 2022-11-10 ENCOUNTER — Encounter: Payer: Self-pay | Admitting: Pharmacist Clinician (PhC)/ Clinical Pharmacy Specialist

## 2022-11-10 ENCOUNTER — Ambulatory Visit
Payer: Medicare Other | Attending: Cardiovascular Disease | Admitting: Pharmacist Clinician (PhC)/ Clinical Pharmacy Specialist

## 2022-11-10 VITALS — BP 136/84 | HR 98

## 2022-11-10 DIAGNOSIS — I10 Essential (primary) hypertension: Secondary | ICD-10-CM

## 2022-11-10 NOTE — Assessment & Plan Note (Signed)
Assessment: BP is uncontrolled in office BP 136/84 mmHg;  above the goal (<130/80). Tolerates diltiazem and chlorthalidone well without any side effects Denies SOB, palpitation, chest pain, headaches,or swelling Reiterated the importance of regular exercise and low salt diet   Plan:  Continue taking current medications.  Patient to keep record of BP readings with heart rate and report to [place] at the next visit - explained need to determine what percent of home readings were WNL vs elevated.   Patient to follow up with PharmD in 2 months  Labs ordered today:  none

## 2022-11-10 NOTE — Patient Instructions (Signed)
Follow up appointment: Monday June 17 at 10:30 am  Take your BP meds as follows: continue with current medications  Check your blood pressure at home 3-4 days each week and keep record of the readings.  Hypertension "High blood pressure"  Hypertension is often called "The Silent Killer." It rarely causes symptoms until it is extremely  high or has done damage to other organs in the body. For this reason, you should have your  blood pressure checked regularly by your physician. We will check your blood pressure  every time you see a provider at one of our offices.   Your blood pressure reading consists of two numbers. Ideally, blood pressure should be  below 120/80. The first ("top") number is called the systolic pressure. It measures the  pressure in your arteries as your heart beats. The second ("bottom") number is called the diastolic pressure. It measures the pressure in your arteries as the heart relaxes between beats.  The benefits of getting your blood pressure under control are enormous. A 10-point  reduction in systolic blood pressure can reduce your risk of stroke by 27% and heart failure by 28%  Your blood pressure goal is < 130/80  To check your pressure at home you will need to:  1. Sit up in a chair, with feet flat on the floor and back supported. Do not cross your ankles or legs. 2. Rest your left arm so that the cuff is about heart level. If the cuff goes on your upper arm,  then just relax the arm on the table, arm of the chair or your lap. If you have a wrist cuff, we  suggest relaxing your wrist against your chest (think of it as Pledging the Flag with the  wrong arm).  3. Place the cuff snugly around your arm, about 1 inch above the crook of your elbow. The  cords should be inside the groove of your elbow.  4. Sit quietly, with the cuff in place, for about 5 minutes. After that 5 minutes press the power  button to start a reading. 5. Do not talk or move while  the reading is taking place.  6. Record your readings on a sheet of paper. Although most cuffs have a memory, it is often  easier to see a pattern developing when the numbers are all in front of you.  7. You can repeat the reading after 1-3 minutes if it is recommended  Make sure your bladder is empty and you have not had caffeine or tobacco within the last 30 min  Always bring your blood pressure log with you to your appointments. If you have not brought your monitor in to be double checked for accuracy, please bring it to your next appointment.  You can find a list of quality blood pressure cuffs at validatebp.org

## 2022-11-10 NOTE — Progress Notes (Signed)
Office Visit    Patient Name: Thomas Patterson Date of Encounter: 11/10/2022  Primary Care Provider:  Catha Gosselin, MD Primary Cardiologist:  Chrystie Nose, MD  Chief Complaint    Hypertension  Significant Past Medical History   AF Permanent - CHADS2-VASc score = 2 (htn, age), on Xarelto  hyperlipidemia 4/24 LDL 69 on atorvastatin 40  ETOH abuse Fatty liver (AKI in 2021 for binge + diuretic use)  Prostate cancer prostatectomy    No Known Allergies  History of Present Illness    Thomas Patterson is a 72 y.o. male patient of Dr Rennis Golden, most recently seen by Azalee Course PA, in the office today for hypertension management.  When he saw Wynema Birch he had not started the amlodipine ordered by Dr. Rennis Golden, as he was already on diltiazem and the pharmacy told him they were in the same class.  He also stopped carvedilol, as it caused severe fatigue (at the 3.125 mg dose).  Hao increased the diltiazem to 360 mg and stopped carvedilol.   Switched HCTZ to chlorthalidone for better control.  Patient was asked to follow up with PharmD  Today he is in the office for follow up.  He notes having been diagnosed with hypertension at least 20 years ago.   States some days his readings are good, other days not as good.  He doesn't have any readings with him today.   20 years  Second reading with large cuff:  136/84  Blood Pressure Goal:  130/80  Current Medications:  diltiazem 360 mg qd, chlorthalidone 25 mg qd  Previously tried:  carvedilol - fatigue  Family Hx:   father died CHF  (in his 47's) mother died 13; no siblings, kids healthy  Social Hx:      Tobacco:  no  Alcohol: 1-2 beer per day  Caffeine:  coffee in the am 2-3 at most; occasional diet coke (~1/day)  Diet:   mix of home and eating out; vegetables usually fresh, variety of meats; currently snacks on popcorn   Exercise: not enough - yard work; stays busy around the house  Home BP readings:    home meter arm cuff Home Life about 43-81 years old;    By recall - highest 167/90, lowest 110/70  Adherence Assessment  Do you ever forget to take your medication? [] Yes [x] No  Do you ever skip doses due to side effects? [] Yes [x] No  Do you have trouble affording your medicines? [x] Yes - xarelto gets expensive in coverage gap [] No  Are you ever unable to pick up your medication due to transportation difficulties? [] Yes [x] No  Do you ever stop taking your medications because you don't believe they are helping? [] Yes [x] No    Accessory Clinical Findings    Lab Results  Component Value Date   CREATININE 1.62 (H) 10/11/2019   BUN 37 (H) 10/11/2019   NA 137 10/11/2019   K 3.7 10/11/2019   CL 106 10/11/2019   CO2 24 10/11/2019   Lab Results  Component Value Date   ALT 66 (H) 10/11/2019   AST 86 (H) 10/11/2019   ALKPHOS 113 10/11/2019   BILITOT 1.4 (H) 10/11/2019   No results found for: "HGBA1C"  Home Medications    Current Outpatient Medications  Medication Sig Dispense Refill   atorvastatin (LIPITOR) 40 MG tablet TAKE 1 TABLET BY MOUTH DAILY 90 tablet 3   chlorthalidone (HYGROTON) 25 MG tablet Take 1 tablet (25 mg total) by mouth daily. 90 tablet 3   diltiazem (  CARDIZEM CD) 360 MG 24 hr capsule Take 1 capsule (360 mg total) by mouth daily. 90 capsule 3   XARELTO 20 MG TABS tablet Take 1 tablet (20 mg total) by mouth daily with supper. NEEDS CARDIOLOGY APPT, CALL TO SCHEDULE 100 tablet 0   No current facility-administered medications for this visit.         Assessment & Plan    Essential hypertension Assessment: BP is uncontrolled in office BP 136/84 mmHg;  above the goal (<130/80). Tolerates diltiazem and chlorthalidone well without any side effects Denies SOB, palpitation, chest pain, headaches,or swelling Reiterated the importance of regular exercise and low salt diet   Plan:  Continue taking current medications.  Patient to keep record of BP readings with heart rate and report to [place] at the next visit -  explained need to determine what percent of home readings were WNL vs elevated.   Patient to follow up with PharmD in 2 months  Labs ordered today:  none   Phillips Hay PharmD CPP [facility]  [address] [place], [place] [address] [phone]

## 2022-11-15 ENCOUNTER — Other Ambulatory Visit: Payer: Self-pay | Admitting: Internal Medicine

## 2022-11-15 NOTE — Telephone Encounter (Signed)
Pt last saw Azalee Course, [place] on 09/28/22, last labs 10/27/22 Creat 0.87 at [facility] per [facility], age 72, weight 103.9kg, CrCl 114.45, based on CrCl pt is on appropriate dosage of Xarelto  QD for afib.  Will refill rx.

## 2022-12-01 DIAGNOSIS — H35341 Macular cyst, hole, or pseudohole, right eye: Secondary | ICD-10-CM | POA: Diagnosis not present

## 2023-01-10 ENCOUNTER — Encounter: Payer: Self-pay | Admitting: Pharmacist Clinician (PhC)/ Clinical Pharmacy Specialist

## 2023-01-10 ENCOUNTER — Ambulatory Visit
Payer: Medicare Other | Attending: Cardiovascular Disease | Admitting: Pharmacist Clinician (PhC)/ Clinical Pharmacy Specialist

## 2023-01-10 VITALS — BP 142/78

## 2023-01-10 DIAGNOSIS — I10 Essential (primary) hypertension: Secondary | ICD-10-CM

## 2023-01-10 MED ORDER — OLMESARTAN MEDOXOMIL 20 MG PO TABS: 20.0000 mg | ORAL_TABLET | Freq: Every day | ORAL | 3 refills | Status: DC

## 2023-01-10 MED ORDER — DILTIAZEM HCL ER COATED BEADS 300 MG PO CP24: 300.0000 mg | ORAL_CAPSULE | Freq: Every day | ORAL | 1 refills | Status: DC

## 2023-01-10 NOTE — Progress Notes (Signed)
Office Visit    Patient Name: Thomas Patterson Date of Encounter: 01/10/2023  Primary Care Provider:  Orpha Bur, MD Primary Cardiologist:  Chrystie Nose, MD  Chief Complaint    Hypertension  Significant Past Medical History   AF Permanent - CHADS2-VASc score = 2 (htn, age), on Xarelto  hyperlipidemia 4/24 LDL 69 on atorvastatin 40  ETOH abuse Fatty liver (AKI in 2021 for binge + diuretic use)  Prostate cancer prostatectomy    No Known Allergies  History of Present Illness    Thomas Patterson is a 72 y.o. male patient of Dr Rennis Golden, most recently seen by Azalee Course PA, in the office today for hypertension management.  When he saw Wynema Birch he had not started the amlodipine ordered by Dr. Rennis Golden, as he was already on diltiazem and the pharmacy told him they were in the same class.  He also stopped carvedilol, as it caused severe fatigue (at the 3.125 mg dose).  Thomas Patterson increased the diltiazem to 360 mg and stopped carvedilol.   Switched HCTZ to chlorthalidone for better control.  Patient was asked to follow up with PharmD.  I saw him in April, at which time his pressure was 136/84.  He did not have any home readings.  He was asked to monitor at home, write down the readings and bring that information, with his monitor for follow up.    Today he is in the office for follow up.  He notes that he has trouble with feeling tired and often wants to take a nap to get thorough his day.  On a couple of occasions he has decreased diltiazem to 300 mg (from previous prescription) and felt less fatigued.    Blood Pressure Goal:  130/80  Current Medications:  diltiazem 360 mg qd, chlorthalidone 25 mg qd  Previously tried:  carvedilol - fatigue  Family Hx:   father died CHF  (in his 57's) mother died 49; no siblings, kids healthy  Social Hx:      Tobacco:  no  Alcohol: 1-2 beer per day  Caffeine:  coffee in the am 2-3 at most; occasional diet coke (~1/day)  Diet:   mix of home and eating out; vegetables  usually fresh, variety of meats; currently snacks on popcorn   Exercise: not enough - yard work; stays busy around the house  Home BP readings:    home meter arm cuff Home Life about 32-78 years old; did not read within 10 points of office (systolic <> 10 points higher, diastolic > 10 points lower.  He will look into getting a new cuff.    14 home readings average 140/82 (range 119-153/78-92)  Adherence Assessment  Do you ever forget to take your medication? [] Yes [x] No  Do you ever skip doses due to side effects? [] Yes [x] No  Do you have trouble affording your medicines? [x] Yes - xarelto gets expensive in coverage gap [] No  Are you ever unable to pick up your medication due to transportation difficulties? [] Yes [x] No  Do you ever stop taking your medications because you don't believe they are helping? [] Yes [x] No    Accessory Clinical Findings    Lab Results  Component Value Date   CREATININE 1.62 (H) 10/11/2019   BUN 37 (H) 10/11/2019   NA 137 10/11/2019   K 3.7 10/11/2019   CL 106 10/11/2019   CO2 24 10/11/2019   Lab Results  Component Value Date   ALT 66 (H) 10/11/2019   AST 86 (H) 10/11/2019  ALKPHOS 113 10/11/2019   BILITOT 1.4 (H) 10/11/2019   No results found for: "HGBA1C"  Home Medications    Current Outpatient Medications  Medication Sig Dispense Refill   atorvastatin (LIPITOR) 40 MG tablet TAKE 1 TABLET BY MOUTH DAILY 90 tablet 3   chlorthalidone (HYGROTON) 25 MG tablet Take 1 tablet (25 mg total) by mouth daily. 90 tablet 3   diltiazem (CARDIZEM CD) 300 MG 24 hr capsule Take 1 capsule (300 mg total) by mouth daily. 30 capsule 1   olmesartan (BENICAR) 20 MG tablet Take 1 tablet (20 mg total) by mouth daily. 90 tablet 3   XARELTO 20 MG TABS tablet TAKE 1 TABLET BY MOUTH DAILY  WITH SUPPER 100 tablet 1   No current facility-administered medications for this visit.     HYPERTENSION CONTROL Vitals:   01/10/23 1037 01/10/23 1100  BP: (!) 142/98 (!)  142/78    The patient's blood pressure is elevated above target today.  In order to address the patient's elevated BP: A new medication was prescribed today.; A current anti-hypertensive medication was adjusted today.      Assessment & Plan    Essential hypertension Assessment: BP is uncontrolled in office BP 142/78 mmHg;  above the goal (<130/80). Complains of fatigue with high dose diltiazem Tolerates chlorthalidone well without any side effects Reiterated the importance of regular exercise and low salt diet   Plan:  Decrease diltiazem from 360 to 300 mg daily Start taking olmesartan 20 mg once daily Continue taking chlorthalidone 25 mg once daily Patient to keep record of BP readings with heart rate and report to [place] at the next visit Patient to follow up with PharmD in 6 weeks  Labs ordered today:  BMET in 2 weeks   Phillips Hay PharmD CPP [facility]  [address] [place], [place] [address] [phone]

## 2023-01-10 NOTE — Assessment & Plan Note (Signed)
Assessment: BP is uncontrolled in office BP 142/78 mmHg;  above the goal (<130/80). Complains of fatigue with high dose diltiazem Tolerates chlorthalidone well without any side effects Reiterated the importance of regular exercise and low salt diet   Plan:  Decrease diltiazem from 360 to 300 mg daily Start taking olmesartan 20 mg once daily Continue taking chlorthalidone 25 mg once daily Patient to keep record of BP readings with heart rate and report to [place] at the next visit Patient to follow up with PharmD in 6 weeks  Labs ordered today:  BMET in 2 weeks

## 2023-01-10 NOTE — Patient Instructions (Signed)
Follow up appointment: August 7 at 10:30 am.     Send a MyChart message a week or so before,  if your BP is doing well   Go to the lab in 2 weeks to check kidney function  Take your BP meds as follows:    Decrease diltiazem to 300 mg daily  Start olmesartan 20 mg once daily  Check your blood pressure at home daily (if able) and keep record of the readings.  Hypertension "High blood pressure"  Hypertension is often called "The Silent Killer." It rarely causes symptoms until it is extremely  high or has done damage to other organs in the body. For this reason, you should have your  blood pressure checked regularly by your physician. We will check your blood pressure  every time you see a provider at one of our offices.   Your blood pressure reading consists of two numbers. Ideally, blood pressure should be  below 120/80. The first ("top") number is called the systolic pressure. It measures the  pressure in your arteries as your heart beats. The second ("bottom") number is called the diastolic pressure. It measures the pressure in your arteries as the heart relaxes between beats.  The benefits of getting your blood pressure under control are enormous. A 10-point  reduction in systolic blood pressure can reduce your risk of stroke by 27% and heart failure by 28%  Your blood pressure goal is < 130/80  To check your pressure at home you will need to:  1. Sit up in a chair, with feet flat on the floor and back supported. Do not cross your ankles or legs. 2. Rest your left arm so that the cuff is about heart level. If the cuff goes on your upper arm,  then just relax the arm on the table, arm of the chair or your lap. If you have a wrist cuff, we  suggest relaxing your wrist against your chest (think of it as Pledging the Flag with the  wrong arm).  3. Place the cuff snugly around your arm, about 1 inch above the crook of your elbow. The  cords should be inside the groove of your  elbow.  4. Sit quietly, with the cuff in place, for about 5 minutes. After that 5 minutes press the power  button to start a reading. 5. Do not talk or move while the reading is taking place.  6. Record your readings on a sheet of paper. Although most cuffs have a memory, it is often  easier to see a pattern developing when the numbers are all in front of you.  7. You can repeat the reading after 1-3 minutes if it is recommended  Make sure your bladder is empty and you have not had caffeine or tobacco within the last 30 min  Always bring your blood pressure log with you to your appointments. If you have not brought your monitor in to be double checked for accuracy, please bring it to your next appointment.  You can find a list of quality blood pressure cuffs at validatebp.org

## 2023-03-02 ENCOUNTER — Ambulatory Visit: Payer: Medicare Other

## 2023-03-15 ENCOUNTER — Encounter: Payer: Self-pay | Admitting: Pharmacist

## 2023-03-15 ENCOUNTER — Ambulatory Visit: Payer: Medicare Other | Attending: Cardiology | Admitting: Pharmacist

## 2023-03-15 VITALS — BP 135/84 | HR 114

## 2023-03-15 DIAGNOSIS — I10 Essential (primary) hypertension: Secondary | ICD-10-CM

## 2023-03-15 NOTE — Patient Instructions (Addendum)
It was nice meeting you today  We would like your blood pressure to stay less than 130/80  Please reduce your olmesartan to 10mg  (1/2 tablet) once a day  Continue your chlorthalidone 25mg  and diltiazem 300mg   Continue to monitor blood pressure at home  Let us know if you have any questions  Laural Golden, PharmD, BCACP, CDCES, CPP [address] [place], [place], [address] Phone: [phone], Fax: [phone]

## 2023-03-15 NOTE — Progress Notes (Signed)
Patient ID: Thomas Patterson                 DOB: 1951/03/24                      MRN: [id number]     HPI: Thomas Patterson is a 72 y.o. male referred by Azalee Course to HTN clinic. Patient of Dr Andrez Grime. PMH is significant for A fib, HTN, and prostate cancer. Olmesartan added at last visit.  Patient presents today to discuss HTN manageent. Has been checking blood pressure at home and it has decreased since starting olmesartan. He begins to feel tired and disoriented when systolic BP is <110.  Recent BP readings: 8/16: 116/81 8/13: 114/76 8/11: 112/82 8/6: 107/77 8/5: 115/77  BP began to trend down late July and he began to feel poorly. He began cutting his diltiazem 300mg  in half.  Takes all medications in the morning. Checks BP in afternoon.  Traveling to beach this weekend and will be gone for 7 days.  Did not have lab work completed after last visit.  Current HTN meds:  Chlorthalidone 25mg  daily Diltiazem 300mg  daily (has been taking 150mg ) Olmesartan 20mg  daily  BP goal: <130/80  Wt Readings from Last 3 Encounters:  09/28/22 229 lb (103.9 kg)  03/25/21 224 lb 12.8 oz (102 kg)  10/17/19 226 lb (102.5 kg)   BP Readings from Last 3 Encounters:  01/10/23 (!) 142/78  11/10/22 136/84  09/28/22 132/82   Pulse Readings from Last 3 Encounters:  11/10/22 98  09/28/22 88  03/25/21 95    Renal function: CrCl cannot be calculated (Patient's most recent lab result is older than the maximum 21 days allowed.).  Past Medical History:  Diagnosis Date   ETOH abuse    Hypertension    LVH (left ventricular hypertrophy)    a. 2011 Echo: mild LVH.   Persistent atrial fibrillation (HCC)    a. 2011 Echo: EF 55-60%, mild LVH.   Prostate cancer (HCC)    Skin cancer    basal cell removed from scalp and chest/regular check ups with dermatologist every six months    Current Outpatient Medications on File Prior to Visit  Medication Sig Dispense Refill   atorvastatin (LIPITOR) 40 MG tablet  TAKE 1 TABLET BY MOUTH DAILY 90 tablet 3   chlorthalidone (HYGROTON) 25 MG tablet Take 1 tablet (25 mg total) by mouth daily. 90 tablet 3   diltiazem (CARDIZEM CD) 300 MG 24 hr capsule Take 1 capsule (300 mg total) by mouth daily. 30 capsule 1   olmesartan (BENICAR) 20 MG tablet Take 1 tablet (20 mg total) by mouth daily. 90 tablet 3   XARELTO 20 MG TABS tablet TAKE 1 TABLET BY MOUTH DAILY  WITH SUPPER 100 tablet 1   No current facility-administered medications on file prior to visit.    No Known Allergies   Assessment/Plan:  1. Hypertension -  Patient BP in room 135/84 which is improved but still slightly above goal of <130/80. Pulse rate elevated. Patient believes he is in a fib.. Advised he should go back to diltiazem 300mg  since he has extended release tablets and there was no way to guarantee what dose he was taking when splitting them.  Will decrease olmesartan to 10mg  at this time and continue chlorthalidone 25mg  daily since patient feels better when BP ~120. Patient voiced understanding. Recheck in 4 weeks  Encouraged patient to have lab work drawn and explained importance. Patient declines at this  time.  Continue diltiazem 300mg  daily Continue chlorthalidone 25mg  daily Reduce olmesartan to 10mg  once a day Recheck in 4 weeks  Laural Golden, PharmD, BCACP, CDCES, CPP [address] [place], [place], [address] Phone: [phone], Fax: [phone]

## 2023-03-25 DIAGNOSIS — C44311 Basal cell carcinoma of skin of nose: Secondary | ICD-10-CM | POA: Diagnosis not present

## 2023-03-25 DIAGNOSIS — D485 Neoplasm of uncertain behavior of skin: Secondary | ICD-10-CM | POA: Diagnosis not present

## 2023-03-25 DIAGNOSIS — D2272 Melanocytic nevi of left lower limb, including hip: Secondary | ICD-10-CM | POA: Diagnosis not present

## 2023-03-30 DIAGNOSIS — H524 Presbyopia: Secondary | ICD-10-CM | POA: Diagnosis not present

## 2023-03-30 DIAGNOSIS — H35341 Macular cyst, hole, or pseudohole, right eye: Secondary | ICD-10-CM | POA: Diagnosis not present

## 2023-04-14 DIAGNOSIS — D485 Neoplasm of uncertain behavior of skin: Secondary | ICD-10-CM | POA: Diagnosis not present

## 2023-04-14 DIAGNOSIS — L988 Other specified disorders of the skin and subcutaneous tissue: Secondary | ICD-10-CM | POA: Diagnosis not present

## 2023-04-15 ENCOUNTER — Ambulatory Visit: Payer: Medicare Other | Attending: Cardiovascular Disease | Admitting: Pharmacist

## 2023-04-15 ENCOUNTER — Encounter: Payer: Self-pay | Admitting: Pharmacist

## 2023-04-15 VITALS — BP 127/77 | HR 104

## 2023-04-15 DIAGNOSIS — I10 Essential (primary) hypertension: Secondary | ICD-10-CM

## 2023-04-15 DIAGNOSIS — I4821 Permanent atrial fibrillation: Secondary | ICD-10-CM | POA: Diagnosis not present

## 2023-04-15 MED ORDER — DILTIAZEM HCL ER COATED BEADS 300 MG PO CP24: 300.0000 mg | ORAL_CAPSULE | Freq: Every day | ORAL | 2 refills | Status: DC

## 2023-04-15 NOTE — Progress Notes (Unsigned)
Patient ID: Thomas Patterson                 DOB: May 03, 1951                      MRN: [id number]     HPI: Thomas Patterson is a 72 y.o. male referred by Azalee Course to HTN clinic. Patient of Dr Andrez Grime. PMH is significant for A fib, HTN, and prostate cancer. Olmesartan dose reduced at last visit.  Patient presents today for follow up HTN manageent. Has been checking blood pressure at home and he feels better since reducing olmesartan dose. His main concern is his 20mg  tablets are difficult to split and he does not know how much he is receiving with each dose.  Recent BP readings: 9/18: 129/74 9/14: 134/83 9/11: 128/78 9/7: 116/78 9/6: 132/88  Has increased diltiazem back to 300mg  as instructed.   Has not updated lab work.  Current HTN meds:  Chlorthalidone 25mg  daily Diltiazem 300mg  daily  Olmesartan 10mg  daily (1/2 of 20mg  tablet)  BP goal: <130/80  Wt Readings from Last 3 Encounters:  09/28/22 229 lb (103.9 kg)  03/25/21 224 lb 12.8 oz (102 kg)  10/17/19 226 lb (102.5 kg)   BP Readings from Last 3 Encounters:  03/15/23 135/84  01/10/23 (!) 142/78  11/10/22 136/84   Pulse Readings from Last 3 Encounters:  03/15/23 (!) 114  11/10/22 98  09/28/22 88    Renal function: CrCl cannot be calculated (Patient's most recent lab result is older than the maximum 21 days allowed.).  Past Medical History:  Diagnosis Date   ETOH abuse    Hypertension    LVH (left ventricular hypertrophy)    a. 2011 Echo: mild LVH.   Persistent atrial fibrillation (HCC)    a. 2011 Echo: EF 55-60%, mild LVH.   Prostate cancer (HCC)    Skin cancer    basal cell removed from scalp and chest/regular check ups with dermatologist every six months    Current Outpatient Medications on File Prior to Visit  Medication Sig Dispense Refill   atorvastatin (LIPITOR) 40 MG tablet TAKE 1 TABLET BY MOUTH DAILY 90 tablet 3   chlorthalidone (HYGROTON) 25 MG tablet Take 1 tablet (25 mg total) by mouth daily. 90 tablet  3   diltiazem (CARDIZEM CD) 300 MG 24 hr capsule Take 1 capsule (300 mg total) by mouth daily. 30 capsule 1   olmesartan (BENICAR) 20 MG tablet Take 0.5 tablets (10 mg total) by mouth daily.     XARELTO 20 MG TABS tablet TAKE 1 TABLET BY MOUTH DAILY  WITH SUPPER 100 tablet 1   No current facility-administered medications on file prior to visit.    No Known Allergies   Assessment/Plan:  1. Hypertension -  Patient BP in room 127/77 which is at goal of <130/80. Pulse rate elevated. Patient believes he is in a fib. Compliant with Xarelto.  Will continue olemsartan 10mg  since patient feels better. Advised that since they do not manufacture 10mg  tablets, offered to switch to two 5mg  tablets. Patient declines due to not wanting to take more tablets a day. He will continue to cut his 20mg  tablets.  Advised he is still due for lab work since starting chlorthalidone but he declines again.  Recommend continuing to monitor BP at home and to contact office for continued readings >130/80. Patient voiced understanding.  Continue diltiazem 300mg  daily Continue chlorthalidone 25mg  daily Continue 10mg  once a day Follow up as  needed  Laural Golden, PharmD, BCACP, CDCES, CPP [address], [address] [place], [place], [address] Phone: [phone], Fax: [phone]

## 2023-04-15 NOTE — Patient Instructions (Addendum)
It was good seeing you again  We would like your blood pressure to be less than 130/80  Please continue your: Chlorthalidone 25mg  daily Diltiazem 300mg  daily Olmesartan 10mg  (1/2 of the 20mg  tablet)  Continue to watch your diet and continue your physical activity  Continue to monitor your blood pressure at home and let us know if it consistently is above 130/80  You can update your lab work at any time  Laural Golden, PharmD, BCACP, CDCES, CPP [address] [place], [place], [address] Phone: [phone], Fax: [phone]

## 2023-05-09 ENCOUNTER — Other Ambulatory Visit: Payer: Self-pay | Admitting: Internal Medicine

## 2023-05-09 NOTE — Telephone Encounter (Signed)
Prescription refill request for Xarelto received.  Indication:AFIB Last office visit:9/24 Weight:103.9  kg Age:72 Scr:0.87  4/24 CrCl:112.79  ml/min  Prescription refilled

## 2023-05-12 DIAGNOSIS — Z85828 Personal history of other malignant neoplasm of skin: Secondary | ICD-10-CM | POA: Diagnosis not present

## 2023-05-12 DIAGNOSIS — Z08 Encounter for follow-up examination after completed treatment for malignant neoplasm: Secondary | ICD-10-CM | POA: Diagnosis not present

## 2023-05-12 DIAGNOSIS — D485 Neoplasm of uncertain behavior of skin: Secondary | ICD-10-CM | POA: Diagnosis not present

## 2023-07-04 ENCOUNTER — Other Ambulatory Visit: Payer: Self-pay | Admitting: Physician Assistant

## 2023-07-28 DIAGNOSIS — J209 Acute bronchitis, unspecified: Secondary | ICD-10-CM | POA: Diagnosis not present

## 2023-09-12 ENCOUNTER — Other Ambulatory Visit: Payer: Self-pay | Admitting: Physician Assistant

## 2023-10-05 ENCOUNTER — Other Ambulatory Visit: Payer: Self-pay | Admitting: Internal Medicine

## 2023-10-05 DIAGNOSIS — E7800 Pure hypercholesterolemia, unspecified: Secondary | ICD-10-CM

## 2023-11-01 DIAGNOSIS — I10 Essential (primary) hypertension: Secondary | ICD-10-CM | POA: Diagnosis not present

## 2023-11-01 DIAGNOSIS — Z0000 Encounter for general adult medical examination without abnormal findings: Secondary | ICD-10-CM | POA: Diagnosis not present

## 2023-11-01 DIAGNOSIS — Z8739 Personal history of other diseases of the musculoskeletal system and connective tissue: Secondary | ICD-10-CM | POA: Diagnosis not present

## 2023-11-01 DIAGNOSIS — E785 Hyperlipidemia, unspecified: Secondary | ICD-10-CM | POA: Diagnosis not present

## 2023-11-01 DIAGNOSIS — I77811 Abdominal aortic ectasia: Secondary | ICD-10-CM | POA: Diagnosis not present

## 2023-11-01 DIAGNOSIS — D6869 Other thrombophilia: Secondary | ICD-10-CM | POA: Diagnosis not present

## 2023-11-01 DIAGNOSIS — Z860100 Personal history of colon polyps, unspecified: Secondary | ICD-10-CM | POA: Diagnosis not present

## 2023-11-01 DIAGNOSIS — I4811 Longstanding persistent atrial fibrillation: Secondary | ICD-10-CM | POA: Diagnosis not present

## 2023-11-01 DIAGNOSIS — Z23 Encounter for immunization: Secondary | ICD-10-CM | POA: Diagnosis not present

## 2023-11-01 DIAGNOSIS — R7303 Prediabetes: Secondary | ICD-10-CM | POA: Diagnosis not present

## 2023-11-01 DIAGNOSIS — G5793 Unspecified mononeuropathy of bilateral lower limbs: Secondary | ICD-10-CM | POA: Diagnosis not present

## 2023-11-01 LAB — LAB REPORT - SCANNED
A1c: 6.4
EGFR: 88

## 2023-11-21 ENCOUNTER — Telehealth (HOSPITAL_BASED_OUTPATIENT_CLINIC_OR_DEPARTMENT_OTHER): Payer: Self-pay | Admitting: *Deleted

## 2023-11-21 DIAGNOSIS — Z800 Family history of malignant neoplasm of digestive organs: Secondary | ICD-10-CM | POA: Diagnosis not present

## 2023-11-21 DIAGNOSIS — Z860101 Personal history of adenomatous and serrated colon polyps: Secondary | ICD-10-CM | POA: Diagnosis not present

## 2023-11-21 DIAGNOSIS — I4891 Unspecified atrial fibrillation: Secondary | ICD-10-CM | POA: Diagnosis not present

## 2023-11-21 NOTE — Telephone Encounter (Signed)
 Spoke with Thomas Patterson for [employer] and she is going to send a message to the nurse to have her send over all the labs patient got done in early part of April.

## 2023-11-21 NOTE — Telephone Encounter (Signed)
 Patient with diagnosis of afib on Xarelto  for anticoagulation.    Procedure: colonoscopy Date of procedure: 12/21/23   CHA2DS2-VASc Score = 3   This indicates a 3.2% annual risk of stroke. The patient's score is based upon: CHF History: 1 HTN History: 1 Diabetes History: 0 Stroke History: 0 Vascular Disease History: 0 Age Score: 1 Gender Score: 0      CrCl  Platelet count   It looks like pt had CMP 11/01/23 but I cannot see results. Can we see if we can call PCP for these? Labs done at [facility]  Patient has not had an Afib/aflutter ablation within the last 3 months or DCCV within the last 30 days   **This guidance is not considered finalized until pre-operative APP has relayed final recommendations.**

## 2023-11-21 NOTE — Telephone Encounter (Signed)
   Pre-operative Risk Assessment    Patient Name: Thomas Patterson  DOB: February 07, 1951 MRN: [id number]   Date of last office visit: 09/28/2023 Date of next office visit: None  Request for Surgical Clearance    Procedure:   Colonoscopy  Date of Surgery:  Clearance 12/21/23                                 Surgeon:  Dr. Felecia Hopper Surgeon's Group or Practice Name:  [facility] Phone number:  [phone] Fax number:  [phone]   Type of Clearance Requested:   - Medical  - Pharmacy:  Hold Rivaroxaban  (Xarelto ) 2 days prior   Type of Anesthesia:  Not Indicated   Additional requests/questions:    Signed, Lauris Port   11/21/2023, 12:05 PM

## 2023-11-21 NOTE — Telephone Encounter (Signed)
 Preoperative team,  Patient needs updated labs.  Please request labs from PCP which were done in [place].  Thank you for your help.  Chet Cota. Yulonda Wheeling NP-C     11/21/2023, 4:14 PM [facility] [address] Office [phone] Fax [phone]

## 2023-11-24 NOTE — Telephone Encounter (Signed)
 S/w PCP office and asked if they could please fax labs again as I do not see that we received them . PCP office will fax all labs from April 2025. I gave fax# [phone] attn: preop team. Once we have the labs I will be sure to update the chart and let the preop APP and preop Pharm know labs are in.

## 2023-11-28 NOTE — Telephone Encounter (Signed)
 Labs have been received and are under the media tab in the pt's chart. I will send this send this to the preop APP and preop Pharm-d for further recommendations.

## 2023-11-28 NOTE — Telephone Encounter (Signed)
 I s/w PCP Dr. Celinda Collar office and stated that I s/w their office last week and asking for lab results from April 2025 to be faxed to our office. See previous notes. I explained what we received last week were the orders and not the lab results. Their office will fax the lab results [phone] our same day fax. Once I have the results I will be sure to update the preop APP.

## 2023-11-29 NOTE — Telephone Encounter (Signed)
 Pt has appt 12/07/23 with Dr. Maximo Spar. I will update all parties involved.

## 2023-11-29 NOTE — Telephone Encounter (Signed)
 Left message for pt call back to schedule in office preop appt.

## 2023-11-29 NOTE — Telephone Encounter (Signed)
 Primary Cardiologist:Kenneth C Hilty, MD  Chart reviewed as part of pre-operative protocol coverage. Because of Keng Romanos past medical history and time since last visit, he/she will require a follow-up visit in order to better assess preoperative cardiovascular risk.  Pre-op covering staff: - Please schedule appointment and call patient to inform them. - Please contact requesting surgeon's office via preferred method (i.e, phone, fax) to inform them of need for appointment prior to surgery.  Awaiting Pharm D recommendations for holding Xarelto , labs have been received as of 11/28/23.  Gerldine Koch, NP-C  11/29/2023, 11:47 AM [address], [address] [place], [place] [address] Office [phone] Fax [phone]

## 2023-12-07 ENCOUNTER — Encounter: Payer: Self-pay | Admitting: Internal Medicine

## 2023-12-07 ENCOUNTER — Ambulatory Visit: Attending: Internal Medicine | Admitting: Internal Medicine

## 2023-12-07 VITALS — BP 130/78 | HR 106 | Ht 71.0 in | Wt 230.8 lb

## 2023-12-07 DIAGNOSIS — I872 Venous insufficiency (chronic) (peripheral): Secondary | ICD-10-CM

## 2023-12-07 DIAGNOSIS — I4821 Permanent atrial fibrillation: Secondary | ICD-10-CM | POA: Diagnosis not present

## 2023-12-07 DIAGNOSIS — I10 Essential (primary) hypertension: Secondary | ICD-10-CM | POA: Diagnosis not present

## 2023-12-07 DIAGNOSIS — Z7901 Long term (current) use of anticoagulants: Secondary | ICD-10-CM | POA: Diagnosis not present
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
## 2023-12-07 NOTE — Patient Instructions (Signed)
 Medication Instructions:  OK to hold XARELTO  2 days before procedure   *If you need a refill on your cardiac medications before your next appointment, please call your pharmacy*  Follow-Up: At [facility], you and your health needs are our priority.  As part of our continuing mission to provide you with exceptional heart care, our providers are all part of one team.  This team includes your primary Cardiologist (physician) and Advanced Practice Providers or APPs (Physician Assistants and Nurse Practitioners) who all work together to provide you with the care you need, when you need it.  Your next appointment:    12 months with Dr. Maximo Spar  We recommend signing up for the patient portal called "MyChart".  Sign up information is provided on this After Visit Summary.  MyChart is used to connect with patients for Virtual Visits (Telemedicine).  Patients are able to view lab/test results, encounter notes, upcoming appointments, etc.  Non-urgent messages can be sent to your provider as well.   To learn more about what you can do with MyChart, go to [email or web address].

## 2023-12-07 NOTE — Telephone Encounter (Signed)
 Patient with diagnosis of afib on Xarelto  for anticoagulation.     Procedure: colonoscopy Date of procedure: 12/21/23     CHA2DS2-VASc Score = 3   This indicates a 3.2% annual risk of stroke. The patient's score is based upon: CHF History: 1 HTN History: 1 Diabetes History: 0 Stroke History: 0 Vascular Disease History: 0 Age Score: 1 Gender Score: 0       CrCl 89 ml/min Platelet count 159    Patient has not had an Afib/aflutter ablation within the last 3 months or DCCV within the last 30 days  Per protocol patient can hold Xarelto  for 2 days prior to procedure.     **This guidance is not considered finalized until pre-operative APP has relayed final recommendations.**

## 2023-12-12 ENCOUNTER — Other Ambulatory Visit: Payer: Self-pay | Admitting: Internal Medicine

## 2023-12-16 DIAGNOSIS — Z87891 Personal history of nicotine dependence: Secondary | ICD-10-CM | POA: Diagnosis not present

## 2023-12-16 DIAGNOSIS — Z136 Encounter for screening for cardiovascular disorders: Secondary | ICD-10-CM | POA: Diagnosis not present

## 2023-12-19 ENCOUNTER — Other Ambulatory Visit: Payer: Self-pay | Admitting: Internal Medicine

## 2023-12-19 DIAGNOSIS — I4821 Permanent atrial fibrillation: Secondary | ICD-10-CM

## 2023-12-19 DIAGNOSIS — I10 Essential (primary) hypertension: Secondary | ICD-10-CM

## 2023-12-21 DIAGNOSIS — D125 Benign neoplasm of sigmoid colon: Secondary | ICD-10-CM | POA: Diagnosis not present

## 2023-12-21 DIAGNOSIS — Z860101 Personal history of adenomatous and serrated colon polyps: Secondary | ICD-10-CM | POA: Diagnosis not present

## 2023-12-21 DIAGNOSIS — K5730 Diverticulosis of large intestine without perforation or abscess without bleeding: Secondary | ICD-10-CM | POA: Diagnosis not present

## 2023-12-21 DIAGNOSIS — D123 Benign neoplasm of transverse colon: Secondary | ICD-10-CM | POA: Diagnosis not present

## 2023-12-21 DIAGNOSIS — Z09 Encounter for follow-up examination after completed treatment for conditions other than malignant neoplasm: Secondary | ICD-10-CM | POA: Diagnosis not present

## 2023-12-21 NOTE — Telephone Encounter (Signed)
 Prescription refill request for Xarelto  received.  Indication: PAF Last office visit: 12/07/23  Claudell Cruz MD Weight: 104.7kg Age: 73 Scr: 0.92 on 11/01/23  Epic CrCl: 107.48  Based on above findings Xarelto  20mg  daily is the appropriate dose.  Refill approved.

## 2023-12-23 DIAGNOSIS — D123 Benign neoplasm of transverse colon: Secondary | ICD-10-CM | POA: Diagnosis not present

## 2023-12-23 DIAGNOSIS — D125 Benign neoplasm of sigmoid colon: Secondary | ICD-10-CM | POA: Diagnosis not present

## 2024-04-28 DIAGNOSIS — H524 Presbyopia: Secondary | ICD-10-CM | POA: Diagnosis not present

## 2024-04-28 DIAGNOSIS — H35341 Macular cyst, hole, or pseudohole, right eye: Secondary | ICD-10-CM | POA: Diagnosis not present

## 2024-05-07 DIAGNOSIS — C44319 Basal cell carcinoma of skin of other parts of face: Secondary | ICD-10-CM | POA: Diagnosis not present

## 2024-05-15 ENCOUNTER — Other Ambulatory Visit: Payer: Self-pay | Admitting: Internal Medicine

## 2024-05-15 DIAGNOSIS — E7800 Pure hypercholesterolemia, unspecified: Secondary | ICD-10-CM

## 2024-05-23 ENCOUNTER — Telehealth: Payer: Self-pay | Admitting: Internal Medicine

## 2024-05-23 DIAGNOSIS — I10 Essential (primary) hypertension: Secondary | ICD-10-CM

## 2024-05-23 MED ORDER — OLMESARTAN MEDOXOMIL 20 MG PO TABS: 10.0000 mg | ORAL_TABLET | Freq: Every day | ORAL | 1 refills | Status: DC

## 2024-05-23 NOTE — Telephone Encounter (Signed)
 Pt c/o medication issue:  1. Name of Medication: Olmesartan    2. How are you currently taking this medication (dosage and times per day)? As written   3. Are you having a reaction (difficulty breathing--STAT)? No   4. What is your medication issue? Medication is not listed on current med list please advise    *STAT* If patient is at the pharmacy, call can be transferred to refill team.   1. Which medications need to be refilled? (please list name of each medication and dose if known) Olmesartan     2. Would you like to learn more about the convenience, safety, & potential cost savings by using the [facility]? no    3. Are you open to using the [facility] (Type [facility]no    4. Which pharmacy/location (including street and city if local pharmacy) is medication to be sent to?[facility] - [place], [place] - [address] [place]    5. Do they need a 30 day or 90 day supply? 90 day

## 2024-07-23 ENCOUNTER — Other Ambulatory Visit: Payer: Self-pay | Admitting: Internal Medicine

## 2024-07-23 NOTE — Telephone Encounter (Signed)
 Prescription refill request for Xarelto  received.  Indication:afib Last office visit:5/25 Weight:104.7  kg Age:73 Scr:0.92  2025 CrCl:105.9  ml/min  Prescription refilled

## 2024-07-31 ENCOUNTER — Other Ambulatory Visit: Payer: Self-pay | Admitting: Internal Medicine

## 2024-07-31 DIAGNOSIS — I10 Essential (primary) hypertension: Secondary | ICD-10-CM
